# Patient Record
Sex: Male | Born: 1965 | Race: White | Hispanic: No | Marital: Single | Smoking: Former smoker
Health system: Southern US, Community
[De-identification: ages and names within clinical notes are randomized; demographics above are authoritative.]

## PROBLEM LIST (undated history)

## (undated) DIAGNOSIS — E785 Hyperlipidemia, unspecified: Secondary | ICD-10-CM

## (undated) DIAGNOSIS — I209 Angina pectoris, unspecified: Secondary | ICD-10-CM

## (undated) DIAGNOSIS — I219 Acute myocardial infarction, unspecified: Secondary | ICD-10-CM

## (undated) DIAGNOSIS — I2510 Atherosclerotic heart disease of native coronary artery without angina pectoris: Secondary | ICD-10-CM

## (undated) DIAGNOSIS — R011 Cardiac murmur, unspecified: Secondary | ICD-10-CM

## (undated) DIAGNOSIS — I10 Essential (primary) hypertension: Secondary | ICD-10-CM

## (undated) HISTORY — PX: AORTIC VALVE REPLACEMENT: SHX41

---

## 1999-12-13 HISTORY — PX: OTHER SURGICAL HISTORY: SHX169

## 2003-03-15 HISTORY — PX: CORONARY STENT PLACEMENT: SHX1402

## 2003-10-14 ENCOUNTER — Inpatient Hospital Stay (HOSPITAL_COMMUNITY): Admission: AD | Admit: 2003-10-14 | Discharge: 2003-10-17 | Payer: Self-pay | Admitting: Cardiology

## 2003-10-14 ENCOUNTER — Encounter: Payer: Self-pay | Admitting: Emergency Medicine

## 2003-10-17 ENCOUNTER — Inpatient Hospital Stay (HOSPITAL_COMMUNITY): Admission: AD | Admit: 2003-10-17 | Discharge: 2003-10-19 | Payer: Self-pay | Admitting: Cardiology

## 2003-10-17 ENCOUNTER — Encounter: Payer: Self-pay | Admitting: Emergency Medicine

## 2013-10-20 ENCOUNTER — Emergency Department (HOSPITAL_COMMUNITY): Payer: Self-pay

## 2013-10-20 ENCOUNTER — Emergency Department: Payer: Self-pay | Admitting: Emergency Medicine

## 2013-10-20 ENCOUNTER — Inpatient Hospital Stay (HOSPITAL_COMMUNITY)
Admission: EM | Admit: 2013-10-20 | Discharge: 2013-10-22 | DRG: 082 | Disposition: A | Discharge status: Home / Self Care | Payer: Self-pay | Attending: General Surgery | Admitting: General Surgery

## 2013-10-20 ENCOUNTER — Encounter (HOSPITAL_COMMUNITY): Payer: Self-pay | Admitting: Emergency Medicine

## 2013-10-20 DIAGNOSIS — S066X9A Traumatic subarachnoid hemorrhage with loss of consciousness of unspecified duration, initial encounter: Secondary | ICD-10-CM

## 2013-10-20 DIAGNOSIS — I10 Essential (primary) hypertension: Secondary | ICD-10-CM | POA: Diagnosis present

## 2013-10-20 DIAGNOSIS — S0100XA Unspecified open wound of scalp, initial encounter: Secondary | ICD-10-CM | POA: Diagnosis present

## 2013-10-20 DIAGNOSIS — F17200 Nicotine dependence, unspecified, uncomplicated: Secondary | ICD-10-CM | POA: Diagnosis present

## 2013-10-20 DIAGNOSIS — I2510 Atherosclerotic heart disease of native coronary artery without angina pectoris: Secondary | ICD-10-CM | POA: Diagnosis present

## 2013-10-20 DIAGNOSIS — I252 Old myocardial infarction: Secondary | ICD-10-CM

## 2013-10-20 DIAGNOSIS — Z87828 Personal history of other (healed) physical injury and trauma: Secondary | ICD-10-CM

## 2013-10-20 DIAGNOSIS — S0101XA Laceration without foreign body of scalp, initial encounter: Secondary | ICD-10-CM

## 2013-10-20 DIAGNOSIS — F10929 Alcohol use, unspecified with intoxication, unspecified: Secondary | ICD-10-CM | POA: Diagnosis present

## 2013-10-20 DIAGNOSIS — E785 Hyperlipidemia, unspecified: Secondary | ICD-10-CM | POA: Diagnosis present

## 2013-10-20 DIAGNOSIS — S066XAA Traumatic subarachnoid hemorrhage with loss of consciousness status unknown, initial encounter: Secondary | ICD-10-CM

## 2013-10-20 DIAGNOSIS — I609 Nontraumatic subarachnoid hemorrhage, unspecified: Secondary | ICD-10-CM | POA: Insufficient documentation

## 2013-10-20 DIAGNOSIS — K219 Gastro-esophageal reflux disease without esophagitis: Secondary | ICD-10-CM | POA: Diagnosis present

## 2013-10-20 DIAGNOSIS — J9600 Acute respiratory failure, unspecified whether with hypoxia or hypercapnia: Secondary | ICD-10-CM | POA: Diagnosis present

## 2013-10-20 DIAGNOSIS — F1010 Alcohol abuse, uncomplicated: Secondary | ICD-10-CM | POA: Diagnosis present

## 2013-10-20 HISTORY — DX: Atherosclerotic heart disease of native coronary artery without angina pectoris: I25.10

## 2013-10-20 HISTORY — DX: Acute myocardial infarction, unspecified: I21.9

## 2013-10-20 HISTORY — DX: Angina pectoris, unspecified: I20.9

## 2013-10-20 HISTORY — DX: Cardiac murmur, unspecified: R01.1

## 2013-10-20 HISTORY — DX: Essential (primary) hypertension: I10

## 2013-10-20 LAB — I-STAT CHEM 8, ED
BUN: 5 mg/dL — ABNORMAL LOW (ref 6–23)
CALCIUM ION: 0.94 mmol/L — AB (ref 1.12–1.23)
CREATININE: 1.3 mg/dL (ref 0.50–1.35)
Chloride: 102 meq/L (ref 96–112)
GLUCOSE: 98 mg/dL (ref 70–99)
HEMATOCRIT: 50 % (ref 39.0–52.0)
HEMOGLOBIN: 17 g/dL (ref 13.0–17.0)
POTASSIUM: 4 meq/L (ref 3.7–5.3)
Sodium: 141 meq/L (ref 137–147)
TCO2: 24 mmol/L (ref 0–100)

## 2013-10-20 LAB — BASIC METABOLIC PANEL
Anion Gap: 12 (ref 7–16)
BUN: 5 mg/dL — ABNORMAL LOW (ref 7–18)
Calcium, Total: 8.2 mg/dL — ABNORMAL LOW (ref 8.5–10.1)
Chloride: 103 mmol/L (ref 98–107)
Co2: 26 mmol/L (ref 21–32)
Creatinine: 0.87 mg/dL (ref 0.60–1.30)
Glucose: 101 mg/dL — ABNORMAL HIGH (ref 65–99)
Osmolality: 279 (ref 275–301)
Potassium: 2.9 mmol/L — ABNORMAL LOW (ref 3.5–5.1)
SODIUM: 141 mmol/L (ref 136–145)

## 2013-10-20 LAB — CBC WITH DIFFERENTIAL/PLATELET
Basophil #: 0 10*3/uL (ref 0.0–0.1)
Basophil %: 0.4 %
EOS ABS: 0.1 10*3/uL (ref 0.0–0.7)
Eosinophil %: 1.5 %
HCT: 51.5 % (ref 40.0–52.0)
HGB: 17.3 g/dL (ref 13.0–18.0)
LYMPHS ABS: 2.3 10*3/uL (ref 1.0–3.6)
LYMPHS PCT: 29.2 %
MCH: 33.4 pg (ref 26.0–34.0)
MCHC: 33.6 g/dL (ref 32.0–36.0)
MCV: 100 fL (ref 80–100)
Monocyte #: 0.7 x10 3/mm (ref 0.2–1.0)
Monocyte %: 8.4 %
NEUTROS PCT: 60.5 %
Neutrophil #: 4.8 10*3/uL (ref 1.4–6.5)
Platelet: 189 10*3/uL (ref 150–440)
RBC: 5.18 10*6/uL (ref 4.40–5.90)
RDW: 13.8 % (ref 11.5–14.5)
WBC: 7.9 10*3/uL (ref 3.8–10.6)

## 2013-10-20 LAB — PROTIME-INR
INR: 0.9
Prothrombin Time: 12 secs (ref 11.5–14.7)

## 2013-10-20 LAB — ETHANOL
ETHANOL %: 0.424 % — AB (ref 0.000–0.080)
ETHANOL LVL: 424 mg/dL — AB

## 2013-10-20 LAB — I-STAT CG4 LACTIC ACID, ED: Lactic Acid, Venous: 2.57 mmol/L — ABNORMAL HIGH (ref 0.5–2.2)

## 2013-10-20 MED ORDER — MIDAZOLAM HCL 2 MG/2ML IJ SOLN
INTRAMUSCULAR | Status: AC
  Filled 2013-10-20: qty 4

## 2013-10-20 MED ORDER — PROPOFOL 10 MG/ML IV EMUL
INTRAVENOUS | Status: AC
  Administered 2013-10-21: 25 ug/kg/min via INTRAVENOUS
  Filled 2013-10-20: qty 100

## 2013-10-20 MED ORDER — PANTOPRAZOLE SODIUM 40 MG PO TBEC
40.0000 mg | DELAYED_RELEASE_TABLET | Freq: Every day | ORAL | Status: DC
  Administered 2013-10-21 – 2013-10-22 (×2): 40 mg via ORAL
  Filled 2013-10-20 (×2): qty 1

## 2013-10-20 MED ORDER — CETYLPYRIDINIUM CHLORIDE 0.05 % MT LIQD
7.0000 mL | Freq: Four times a day (QID) | OROMUCOSAL | Status: DC
  Administered 2013-10-21: 7 mL via OROMUCOSAL

## 2013-10-20 MED ORDER — POTASSIUM CHLORIDE IN NACL 20-0.9 MEQ/L-% IV SOLN
INTRAVENOUS | Status: DC
  Administered 2013-10-21 (×2): via INTRAVENOUS
  Filled 2013-10-20 (×4): qty 1000

## 2013-10-20 MED ORDER — CHLORHEXIDINE GLUCONATE 0.12 % MT SOLN
15.0000 mL | Freq: Two times a day (BID) | OROMUCOSAL | Status: DC
  Administered 2013-10-21 (×2): 15 mL via OROMUCOSAL
  Filled 2013-10-20: qty 15

## 2013-10-20 MED ORDER — PROPOFOL 10 MG/ML IV EMUL
0.0000 ug/kg/min | INTRAVENOUS | Status: DC
  Administered 2013-10-21 (×2): 25 ug/kg/min via INTRAVENOUS
  Filled 2013-10-20: qty 100

## 2013-10-20 MED ORDER — FENTANYL CITRATE 0.05 MG/ML IJ SOLN
50.0000 ug | INTRAMUSCULAR | Status: DC | PRN
  Administered 2013-10-21 (×2): 50 ug via INTRAVENOUS
  Filled 2013-10-20 (×2): qty 2

## 2013-10-20 MED ORDER — PANTOPRAZOLE SODIUM 40 MG IV SOLR
40.0000 mg | Freq: Every day | INTRAVENOUS | Status: DC
Start: 2013-10-21 — End: 2013-10-21
  Filled 2013-10-20: qty 40
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
## 2013-10-21 ENCOUNTER — Inpatient Hospital Stay (HOSPITAL_COMMUNITY): Payer: Self-pay

## 2013-10-21 ENCOUNTER — Encounter (HOSPITAL_COMMUNITY): Payer: Self-pay | Admitting: Radiology

## 2013-10-21 LAB — COMPREHENSIVE METABOLIC PANEL
ALT: 51 U/L (ref 0–53)
AST: 60 U/L — ABNORMAL HIGH (ref 0–37)
Albumin: 3.4 g/dL — ABNORMAL LOW (ref 3.5–5.2)
Alkaline Phosphatase: 83 U/L (ref 39–117)
Anion gap: 16 — ABNORMAL HIGH (ref 5–15)
BUN: 5 mg/dL — ABNORMAL LOW (ref 6–23)
CALCIUM: 7.7 mg/dL — AB (ref 8.4–10.5)
CHLORIDE: 108 meq/L (ref 96–112)
CO2: 23 meq/L (ref 19–32)
Creatinine, Ser: 0.73 mg/dL (ref 0.50–1.35)
GFR calc Af Amer: >90 mL/min (ref >90)
Glucose, Bld: 83 mg/dL (ref 70–99)
Potassium: 3.4 mEq/L — ABNORMAL LOW (ref 3.7–5.3)
SODIUM: 147 meq/L (ref 137–147)
Total Bilirubin: 0.4 mg/dL (ref 0.3–1.2)
Total Protein: 6.7 g/dL (ref 6.0–8.3)

## 2013-10-21 LAB — PREPARE FRESH FROZEN PLASMA
Unit division: 0
Unit division: 0

## 2013-10-21 LAB — I-STAT ARTERIAL BLOOD GAS, ED
ACID-BASE DEFICIT: 3 mmol/L — AB (ref 0.0–2.0)
Bicarbonate: 24.3 mEq/L — ABNORMAL HIGH (ref 20.0–24.0)
O2 Saturation: 99 %
PO2 ART: 165 mmHg — AB (ref 80.0–100.0)
Patient temperature: 97.5
TCO2: 26 mmol/L (ref 0–100)
pCO2 arterial: 48.4 mmHg — ABNORMAL HIGH (ref 35.0–45.0)
pH, Arterial: 7.305 — ABNORMAL LOW (ref 7.350–7.450)

## 2013-10-21 LAB — MRSA PCR SCREENING: MRSA BY PCR: NEGATIVE

## 2013-10-21 LAB — CBC
HCT: 43.8 % (ref 39.0–52.0)
HEMOGLOBIN: 15.5 g/dL (ref 13.0–17.0)
MCH: 33.8 pg (ref 26.0–34.0)
MCHC: 35.4 g/dL (ref 30.0–36.0)
MCV: 95.6 fL (ref 78.0–100.0)
Platelets: 163 10*3/uL (ref 150–400)
RBC: 4.58 MIL/uL (ref 4.22–5.81)
RDW: 13.2 % (ref 11.5–15.5)
WBC: 11 10*3/uL — AB (ref 4.0–10.5)

## 2013-10-21 LAB — TRIGLYCERIDES: TRIGLYCERIDES: 196 mg/dL — AB (ref <150)

## 2013-10-21 LAB — I-STAT TROPONIN, ED: Troponin i, poc: 0 ng/mL (ref 0.00–0.08)

## 2013-10-21 MED ORDER — ACETAMINOPHEN 160 MG/5ML PO SOLN: 650.0000 mg | Freq: Four times a day (QID) | ORAL | Status: DC | PRN

## 2013-10-21 MED ORDER — OXYCODONE HCL 5 MG PO TABS
5.0000 mg | ORAL_TABLET | ORAL | Status: DC | PRN
  Administered 2013-10-21 – 2013-10-22 (×7): 10 mg via ORAL
  Filled 2013-10-21 (×7): qty 2

## 2013-10-21 MED ORDER — MIDAZOLAM HCL 5 MG/5ML IJ SOLN
INTRAMUSCULAR | Status: AC | PRN
  Administered 2013-10-20: 4 mg via INTRAVENOUS

## 2013-10-21 MED ORDER — ACETAMINOPHEN 325 MG PO TABS
650.0000 mg | ORAL_TABLET | Freq: Four times a day (QID) | ORAL | Status: DC | PRN
  Administered 2013-10-21 (×2): 650 mg via ORAL
  Filled 2013-10-21 (×2): qty 2

## 2013-10-21 MED ORDER — IOHEXOL 300 MG/ML  SOLN
100.0000 mL | Freq: Once | INTRAMUSCULAR | Status: AC | PRN
  Administered 2013-10-21: 100 mL via INTRAVENOUS

## 2013-10-21 MED ORDER — POTASSIUM CHLORIDE 10 MEQ/100ML IV SOLN
10.0000 meq | INTRAVENOUS | Status: AC
  Administered 2013-10-21 (×2): 10 meq via INTRAVENOUS
  Filled 2013-10-21: qty 100

## 2013-10-21 MED ORDER — SODIUM CHLORIDE 0.9 % IV SOLN
INTRAVENOUS | Status: AC | PRN
  Administered 2013-10-20: 150 mL/h via INTRAVENOUS

## 2013-10-21 MED ORDER — PROPOFOL 10 MG/ML IV BOLUS
INTRAVENOUS | Status: AC | PRN
  Administered 2013-10-20: 20 ug via INTRAVENOUS

## 2013-10-21 MED ORDER — IOHEXOL 350 MG/ML SOLN
50.0000 mL | Freq: Once | INTRAVENOUS | Status: AC | PRN
  Administered 2013-10-21: 50 mL via INTRAVENOUS

## 2013-10-21 NOTE — Progress Notes (Signed)
Pt. Was transported from CT to 3M10 without any complications.

## 2013-10-21 NOTE — Progress Notes (Signed)
Patient ID: Jack [name], male   DOB: 10/06/1965, 48 y.o.   Jack GhentMRN: [id number] Doing well after extubation. Working with therapies. Will D/C foley. Jack GelinasBurke Jermel Artley, MD, MPH, FACS Trauma: [phone] General Surgery: [phone]

## 2013-10-21 NOTE — Progress Notes (Signed)
10/21/2013- Resp care note- Pt placed on wean at 0822.  Tolerated wean vent settings well.  Pt extubated at 0847 to 4lpm cannula.  Pt suctioned and weaned to extubation as per MD order.  Pt tolerated procedure well with pt vocalizing post extubation.  [id number], rr22, bp142/82 on 4lpm cannula.  Will wean oxygen as tolerated.  RN at bedside for procedure.  Will follow progress.  s Rajanee Schuelke rrt, rcp
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
## 2013-10-21 NOTE — Plan of Care (Signed)
Problem: Acute Treatment Outcomes Goal: Pain controlled Outcome: Progressing Oral Oxy and acetaminophen are current PRN meds.  Keeping pain at a level rate of 8 out of 10.
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
## 2013-10-21 NOTE — Progress Notes (Signed)
Pt. Was transported to CT & back to room 3M10 without any complications.

## 2013-10-21 NOTE — Progress Notes (Signed)
PT Cancellation Note  Patient Details Name: Jack [name] MRN: [id number] DOB: 06/15/1965   Cancelled Treatment:    Reason Eval/Treat Not Completed: Patient not medically ready (active bedrest orders at this time, will follow)   Jack [name], Jack [name] 10/21/2013, 8:52 AM Jack [name], PT DPT  [phone]

## 2013-10-22 ENCOUNTER — Encounter (INDEPENDENT_AMBULATORY_CARE_PROVIDER_SITE_OTHER): Payer: Self-pay | Admitting: Orthopedic Surgery

## 2013-10-22 DIAGNOSIS — F10929 Alcohol use, unspecified with intoxication, unspecified: Secondary | ICD-10-CM | POA: Diagnosis present

## 2013-10-22 DIAGNOSIS — I10 Essential (primary) hypertension: Secondary | ICD-10-CM | POA: Insufficient documentation

## 2013-10-22 DIAGNOSIS — S0101XA Laceration without foreign body of scalp, initial encounter: Secondary | ICD-10-CM

## 2013-10-22 DIAGNOSIS — I609 Nontraumatic subarachnoid hemorrhage, unspecified: Secondary | ICD-10-CM | POA: Insufficient documentation

## 2013-10-22 DIAGNOSIS — J9600 Acute respiratory failure, unspecified whether with hypoxia or hypercapnia: Secondary | ICD-10-CM | POA: Diagnosis present

## 2013-10-22 DIAGNOSIS — K219 Gastro-esophageal reflux disease without esophagitis: Secondary | ICD-10-CM | POA: Insufficient documentation

## 2013-10-22 DIAGNOSIS — E785 Hyperlipidemia, unspecified: Secondary | ICD-10-CM | POA: Insufficient documentation

## 2013-10-22 LAB — BASIC METABOLIC PANEL
Anion gap: 10 (ref 5–15)
BUN: 10 mg/dL (ref 6–23)
CO2: 28 meq/L (ref 19–32)
CREATININE: 0.74 mg/dL (ref 0.50–1.35)
Calcium: 8.3 mg/dL — ABNORMAL LOW (ref 8.4–10.5)
Chloride: 102 mEq/L (ref 96–112)
GFR calc Af Amer: >90 mL/min (ref >90)
GLUCOSE: 109 mg/dL — AB (ref 70–99)
Potassium: 3.7 mEq/L (ref 3.7–5.3)
Sodium: 140 mEq/L (ref 137–147)

## 2013-10-22 LAB — CBC
HCT: 40 % (ref 39.0–52.0)
HEMOGLOBIN: 13.7 g/dL (ref 13.0–17.0)
MCH: 33.3 pg (ref 26.0–34.0)
MCHC: 34.3 g/dL (ref 30.0–36.0)
MCV: 97.3 fL (ref 78.0–100.0)
Platelets: 142 10*3/uL — ABNORMAL LOW (ref 150–400)
RBC: 4.11 MIL/uL — AB (ref 4.22–5.81)
RDW: 13.1 % (ref 11.5–15.5)
WBC: 7.8 10*3/uL (ref 4.0–10.5)

## 2013-10-22 MED ORDER — OXYCODONE-ACETAMINOPHEN 5-325 MG PO TABS: 1.0000 | ORAL_TABLET | ORAL | Status: DC | PRN
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
## 2013-10-22 NOTE — Progress Notes (Signed)
[place] work note from [place]
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
## 2013-10-22 NOTE — Progress Notes (Signed)
Pt given discharge summary including medication list, prescription, education and work note. Information reviewed with pt. Pt d/c home by wheelchair and RN escort with sister and gf.

## 2013-10-22 NOTE — Discharge Summary (Signed)
Physician Discharge Summary  Patient ID: Jack GhentMichael Gunawan MRN: [id number] DOB/AGE: 47/09/1965 47 y.o.  Admit date: 10/20/2013 Discharge date: 10/22/2013  Discharge Diagnoses Patient Active Problem List   Diagnosis Date Noted  . Scalp laceration 10/22/2013  . Alcohol intoxication 10/22/2013  . Acute respiratory failure 10/22/2013  . HTN (hypertension) 10/22/2013  . GERD (gastroesophageal reflux disease) 10/22/2013  . Hyperlipidemia 10/22/2013  . Traumatic subarachnoid hemorrhage 10/20/2013    Consultants Dr. Aliene Beamsandy Kritzer for neurosurgery   Procedures None   HPI: [name] was found down unresponsive. He was [facility] to [facility]. He was intubated and workup found a subarachnoid hemorrhage. [facility] Neurosurgery was consulted and the patient was admitted to the trauma service.   Hospital Course: Neurosurgery was worried about an aneurysmal bleed given the unusual location but CT angiography ruled that out. He was able to be weaned and extubated without difficulty. The patient had no recollection of the etiology of his trauma. He was evaluated by physical and speech therapy and did well. His pain was controlled on oral medications and he was discharged home in improved condition.      Medication List         enalapril 5 MG tablet  Commonly known as:  VASOTEC  Take 5 mg by mouth daily.     hydrochlorothiazide 12.5 MG tablet  Commonly known as:  HYDRODIURIL  Take 25 mg by mouth daily.     lovastatin 10 MG tablet  Commonly known as:  MEVACOR  Take 20 mg by mouth at bedtime.     metoprolol tartrate 25 MG tablet  Commonly known as:  LOPRESSOR  Take 25 mg by mouth 2 (two) times daily.     omeprazole 20 MG capsule  Commonly known as:  PRILOSEC  Take 20 mg by mouth daily.     oxyCODONE-acetaminophen 5-325 MG per tablet  Commonly known as:  ROXICET  Take 1-2 tablets by mouth every 4 (four) hours as needed (Pain).             Follow-up Information   Schedule an appointment as soon as possible for a visit with [name],[name], MD.   Specialty:  Neurosurgery   Contact information:   [address] [place] [address] [phone]       Call [facility]. (As needed)    Contact information:   [address] [place] [address] [phone]       Signed: [name], PA-C Pager: [phone] General Trauma PA Pager: [phone] 10/22/2013, 8:32 AM
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
## 2013-10-22 NOTE — Discharge Summary (Signed)
Discharge is appropriate.  Follow up with neurosurgery.  This patient has been seen and I agree with the findings and treatment plan.  Marta LamasJames O. Gae BonWyatt, III, MD, FACS [phone] (pager) [phone] (direct pager) Trauma Surgeon

## 2013-10-22 NOTE — Progress Notes (Signed)
Appropriate for discharge  This patient has been seen and I agree with the findings and treatment plan.  Marta LamasJames O. Gae BonWyatt, III, MD, FACS [phone] (pager) [phone] (direct pager) Trauma Surgeon

## 2013-10-22 NOTE — Discharge Instructions (Signed)
No driving while taking oxycodone. °
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
## 2014-10-20 ENCOUNTER — Emergency Department
Admission: EM | Admit: 2014-10-20 | Discharge: 2014-10-20 | Disposition: A | Discharge status: Home / Self Care | Payer: Self-pay | Attending: Emergency Medicine | Admitting: Emergency Medicine

## 2014-10-20 ENCOUNTER — Encounter: Payer: Self-pay | Admitting: Emergency Medicine

## 2014-10-20 ENCOUNTER — Other Ambulatory Visit: Payer: Self-pay

## 2014-10-20 DIAGNOSIS — R42 Dizziness and giddiness: Secondary | ICD-10-CM | POA: Insufficient documentation

## 2014-10-20 DIAGNOSIS — I159 Secondary hypertension, unspecified: Secondary | ICD-10-CM | POA: Insufficient documentation

## 2014-10-20 DIAGNOSIS — Z720 Tobacco use: Secondary | ICD-10-CM | POA: Insufficient documentation

## 2014-10-20 DIAGNOSIS — Z79899 Other long term (current) drug therapy: Secondary | ICD-10-CM | POA: Insufficient documentation

## 2014-10-20 LAB — BASIC METABOLIC PANEL
Anion gap: 8 (ref 5–15)
BUN: 6 mg/dL (ref 6–20)
CO2: 28 mmol/L (ref 22–32)
Calcium: 9.2 mg/dL (ref 8.9–10.3)
Chloride: 103 mmol/L (ref 101–111)
Creatinine, Ser: 0.82 mg/dL (ref 0.61–1.24)
GFR calc Af Amer: >60 mL/min (ref >60)
GFR calc non Af Amer: >60 mL/min (ref >60)
Glucose, Bld: 110 mg/dL — ABNORMAL HIGH (ref 65–99)
Potassium: 4.1 mmol/L (ref 3.5–5.1)
Sodium: 139 mmol/L (ref 135–145)

## 2014-10-20 LAB — TROPONIN I: Troponin I: <0.03 ng/mL (ref <0.031)

## 2014-10-20 LAB — CBC
HEMATOCRIT: 48.8 % (ref 40.0–52.0)
HEMOGLOBIN: 17 g/dL (ref 13.0–18.0)
MCH: 35.2 pg — ABNORMAL HIGH (ref 26.0–34.0)
MCHC: 34.8 g/dL (ref 32.0–36.0)
MCV: 101.1 fL — AB (ref 80.0–100.0)
Platelets: 208 10*3/uL (ref 150–440)
RBC: 4.83 MIL/uL (ref 4.40–5.90)
RDW: 13.6 % (ref 11.5–14.5)
WBC: 11.1 10*3/uL — AB (ref 3.8–10.6)

## 2014-10-20 MED ORDER — LOVASTATIN 20 MG PO TABS: 20.0000 mg | ORAL_TABLET | Freq: Every day | ORAL | Status: DC

## 2014-10-20 MED ORDER — ENALAPRIL-HYDROCHLOROTHIAZIDE 5-12.5 MG PO TABS: 1.0000 | ORAL_TABLET | Freq: Every day | ORAL | Status: DC

## 2014-10-20 MED ORDER — METOPROLOL TARTRATE 25 MG PO TABS: 25.0000 mg | ORAL_TABLET | Freq: Two times a day (BID) | ORAL | Status: DC

## 2014-10-20 NOTE — ED Notes (Signed)
Pt presents with dizziness started this am. Pt reports being out of bp for two mths.
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
## 2014-10-20 NOTE — ED Notes (Signed)
Pt reports he has not been taking prescribed bp medications for 3 months due to not being able to afford it, reports he felt dizzy earlier but this has passed and that he has not eaten anything today

## 2014-10-20 NOTE — Discharge Instructions (Signed)
Dizziness °Dizziness is a common problem. It is a feeling of unsteadiness or light-headedness. You may feel like you are about to faint. Dizziness can lead to injury if you stumble or fall. A person of any age group can suffer from dizziness, but dizziness is more common in older adults. °CAUSES  °Dizziness can be caused by many different things, including: °· Middle ear problems. °· Standing for too long. °· Infections. °· An allergic reaction. °· Aging. °· An emotional response to something, such as the sight of blood. °· Side effects of medicines. °· Tiredness. °· Problems with circulation or blood pressure. °· Excessive use of alcohol or medicines, or illegal drug use. °· Breathing too fast (hyperventilation). °· An irregular heart rhythm (arrhythmia). °· A low red blood cell count (anemia). °· Pregnancy. °· Vomiting, diarrhea, fever, or other illnesses that cause body fluid loss (dehydration). °· Diseases or conditions such as Parkinson's disease, high blood pressure (hypertension), diabetes, and thyroid problems. °· Exposure to extreme heat. °DIAGNOSIS  °Your health care provider will ask about your symptoms, perform a physical exam, and perform an electrocardiogram (ECG) to record the electrical activity of your heart. Your health care provider may also perform other heart or blood tests to determine the cause of your dizziness. These may include: °· Transthoracic echocardiogram (TTE). During echocardiography, sound waves are used to evaluate how blood flows through your heart. °· Transesophageal echocardiogram (TEE). °· Cardiac monitoring. This allows your health care provider to monitor your heart rate and rhythm in real time. °· Holter monitor. This is a portable device that records your heartbeat and can help diagnose heart arrhythmias. It allows your health care provider to track your heart activity for several days if needed. °· Stress tests by exercise or by giving medicine that makes the heart beat  faster. °TREATMENT  °Treatment of dizziness depends on the cause of your symptoms and can vary greatly. °HOME CARE INSTRUCTIONS  °· Drink enough fluids to keep your urine clear or pale yellow. This is especially important in very hot weather. In older adults, it is also important in cold weather. °· Take your medicine exactly as directed if your dizziness is caused by medicines. When taking blood pressure medicines, it is especially important to get up slowly. °· Rise slowly from chairs and steady yourself until you feel okay. °· In the morning, first sit up on the side of the bed. When you feel okay, stand slowly while holding onto something until you know your balance is fine. °· Move your legs often if you need to stand in one place for a long time. Tighten and relax your muscles in your legs while standing. °· Have someone stay with you for 1-2 days if dizziness continues to be a problem. Do this until you feel you are well enough to stay alone. Have the person call your health care provider if he or she notices changes in you that are concerning. °· Do not drive or use heavy machinery if you feel dizzy. °· Do not drink alcohol. °SEEK IMMEDIATE MEDICAL CARE IF:  °· Your dizziness or light-headedness gets worse. °· You feel nauseous or vomit. °· You have problems talking, walking, or using your arms, hands, or legs. °· You feel weak. °· You are not thinking clearly or you have trouble forming sentences. It may take a friend or family member to notice this. °· You have chest pain, abdominal pain, shortness of breath, or sweating. °· Your vision changes. °· You notice   any bleeding.  You have side effects from medicine that seems to be getting worse rather than better. MAKE SURE YOU:   Understand these instructions.  Will watch your condition.  Will get help right away if you are not doing well or get worse. Document Released: 08/24/2000 Document Revised: 03/05/2013 Document Reviewed: 09/17/2010 [facility]  Patient Information 2015 [place], [place]. This information is not intended to replace advice given to you by your health care provider. Make sure you discuss any questions you have with your health care provider.  Hypertension Hypertension is another name for high blood pressure. High blood pressure forces your heart to work harder to pump blood. A blood pressure reading has two numbers, which includes a higher number over a lower number (example: 110/72). HOME CARE   Have your blood pressure rechecked by your doctor.  Only take medicine as told by your doctor. Follow the directions carefully. The medicine does not work as well if you skip doses. Skipping doses also puts you at risk for problems.  Do not smoke.  Monitor your blood pressure at home as told by your doctor. GET HELP IF:  You think you are having a reaction to the medicine you are taking.  You have repeat headaches or feel dizzy.  You have puffiness (swelling) in your ankles.  You have trouble with your vision. GET HELP RIGHT AWAY IF:   You get a very bad headache and are confused.  You feel weak, numb, or faint.  You get chest or belly (abdominal) pain.  You throw up (vomit).  You cannot breathe very well. MAKE SURE YOU:   Understand these instructions.  Will watch your condition.  Will get help right away if you are not doing well or get worse. Document Released: 08/17/2007 Document Revised: 03/05/2013 Document Reviewed: 12/21/2012 [facility] Patient Information 2015 [place], [place]. This information is not intended to replace advice given to you by your health care provider. Make sure you discuss any questions you have with your health care provider.

## 2014-11-08 ENCOUNTER — Emergency Department
Admission: EM | Admit: 2014-11-08 | Discharge: 2014-11-08 | Disposition: A | Discharge status: Home / Self Care | Payer: PRIVATE HEALTH INSURANCE | Attending: Emergency Medicine | Admitting: Emergency Medicine

## 2014-11-08 ENCOUNTER — Encounter: Payer: Self-pay | Admitting: Emergency Medicine

## 2014-11-08 DIAGNOSIS — Z720 Tobacco use: Secondary | ICD-10-CM | POA: Diagnosis not present

## 2014-11-08 DIAGNOSIS — I10 Essential (primary) hypertension: Secondary | ICD-10-CM | POA: Diagnosis not present

## 2014-11-08 DIAGNOSIS — M5442 Lumbago with sciatica, left side: Secondary | ICD-10-CM | POA: Diagnosis not present

## 2014-11-08 DIAGNOSIS — Z79899 Other long term (current) drug therapy: Secondary | ICD-10-CM | POA: Insufficient documentation

## 2014-11-08 DIAGNOSIS — M545 Low back pain: Secondary | ICD-10-CM | POA: Diagnosis present

## 2014-11-08 MED ORDER — KETOROLAC TROMETHAMINE 10 MG PO TABS: 10.0000 mg | ORAL_TABLET | Freq: Four times a day (QID) | ORAL | Status: DC | PRN

## 2014-11-08 MED ORDER — OXYCODONE HCL 5 MG PO TABS
5.0000 mg | ORAL_TABLET | Freq: Once | ORAL | Status: AC
Start: 2014-11-08 — End: 2014-11-08
  Administered 2014-11-08: 5 mg via ORAL
  Filled 2014-11-08: qty 1

## 2014-11-08 MED ORDER — KETOROLAC TROMETHAMINE 60 MG/2ML IM SOLN
60.0000 mg | Freq: Once | INTRAMUSCULAR | Status: AC
  Administered 2014-11-08: 60 mg via INTRAMUSCULAR
  Filled 2014-11-08: qty 2

## 2014-11-08 MED ORDER — DIAZEPAM 2 MG PO TABS: 2.0000 mg | ORAL_TABLET | Freq: Three times a day (TID) | ORAL | Status: DC | PRN

## 2014-11-08 MED ORDER — DIAZEPAM 5 MG PO TABS
5.0000 mg | ORAL_TABLET | Freq: Once | ORAL | Status: AC
  Administered 2014-11-08: 5 mg via ORAL
  Filled 2014-11-08: qty 1
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
## 2014-11-08 NOTE — ED Notes (Signed)
Reports doing yard work yesterday and today woke up with back pain.  Worse with movement.

## 2015-04-08 ENCOUNTER — Emergency Department: Payer: Self-pay

## 2015-04-08 ENCOUNTER — Emergency Department: Payer: PRIVATE HEALTH INSURANCE

## 2015-04-08 ENCOUNTER — Encounter: Payer: Self-pay | Admitting: *Deleted

## 2015-04-08 ENCOUNTER — Emergency Department
Admission: EM | Admit: 2015-04-08 | Discharge: 2015-04-08 | Disposition: A | Discharge status: Home / Self Care | Payer: PRIVATE HEALTH INSURANCE | Attending: Emergency Medicine | Admitting: Emergency Medicine

## 2015-04-08 DIAGNOSIS — W11XXXA Fall on and from ladder, initial encounter: Secondary | ICD-10-CM | POA: Insufficient documentation

## 2015-04-08 DIAGNOSIS — Y998 Other external cause status: Secondary | ICD-10-CM | POA: Insufficient documentation

## 2015-04-08 DIAGNOSIS — Z79899 Other long term (current) drug therapy: Secondary | ICD-10-CM | POA: Insufficient documentation

## 2015-04-08 DIAGNOSIS — Y9389 Activity, other specified: Secondary | ICD-10-CM | POA: Insufficient documentation

## 2015-04-08 DIAGNOSIS — S20211A Contusion of right front wall of thorax, initial encounter: Secondary | ICD-10-CM | POA: Insufficient documentation

## 2015-04-08 DIAGNOSIS — Y9289 Other specified places as the place of occurrence of the external cause: Secondary | ICD-10-CM | POA: Insufficient documentation

## 2015-04-08 DIAGNOSIS — W19XXXA Unspecified fall, initial encounter: Secondary | ICD-10-CM

## 2015-04-08 DIAGNOSIS — I10 Essential (primary) hypertension: Secondary | ICD-10-CM | POA: Insufficient documentation

## 2015-04-08 DIAGNOSIS — S5002XA Contusion of left elbow, initial encounter: Secondary | ICD-10-CM | POA: Insufficient documentation

## 2015-04-08 DIAGNOSIS — F17210 Nicotine dependence, cigarettes, uncomplicated: Secondary | ICD-10-CM | POA: Insufficient documentation

## 2015-04-08 MED ORDER — OXYCODONE-ACETAMINOPHEN 5-325 MG PO TABS: 1.0000 | ORAL_TABLET | ORAL | Status: DC | PRN

## 2015-04-08 MED ORDER — IBUPROFEN 800 MG PO TABS: 800.0000 mg | ORAL_TABLET | Freq: Three times a day (TID) | ORAL | Status: DC | PRN

## 2015-04-08 MED ORDER — SULFAMETHOXAZOLE-TRIMETHOPRIM 800-160 MG PO TABS: 1.0000 | ORAL_TABLET | Freq: Two times a day (BID) | ORAL | Status: DC

## 2015-04-08 MED ORDER — OXYCODONE-ACETAMINOPHEN 5-325 MG PO TABS
2.0000 | ORAL_TABLET | Freq: Once | ORAL | Status: AC
  Administered 2015-04-08: 2 via ORAL
  Filled 2015-04-08: qty 2

## 2015-04-08 MED ORDER — KETOROLAC TROMETHAMINE 60 MG/2ML IM SOLN
60.0000 mg | Freq: Once | INTRAMUSCULAR | Status: AC
  Administered 2015-04-08: 60 mg via INTRAMUSCULAR
  Filled 2015-04-08: qty 2
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
## 2015-04-08 NOTE — Discharge Instructions (Signed)
Cryotherapy °Cryotherapy means treatment with cold. Ice or gel packs can be used to reduce both pain and swelling. Ice is the most helpful within the first 24 to 48 hours after an injury or flare-up from overusing a muscle or joint. Sprains, strains, spasms, burning pain, shooting pain, and aches can all be eased with ice. Ice can also be used when recovering from surgery. Ice is effective, has very few side effects, and is safe for most people to use. °PRECAUTIONS  °Ice is not a safe treatment option for people with: °· Raynaud phenomenon. This is a condition affecting small blood vessels in the extremities. Exposure to cold may cause your problems to return. °· Cold hypersensitivity. There are many forms of cold hypersensitivity, including: °· Cold urticaria. Red, itchy hives appear on the skin when the tissues begin to warm after being iced. °· Cold erythema. This is a red, itchy rash caused by exposure to cold. °· Cold hemoglobinuria. Red blood cells break down when the tissues begin to warm after being iced. The hemoglobin that carry oxygen are passed into the urine because they cannot combine with blood proteins fast enough. °· Numbness or altered sensitivity in the area being iced. °If you have any of the following conditions, do not use ice until you have discussed cryotherapy with your caregiver: °· Heart conditions, such as arrhythmia, angina, or chronic heart disease. °· High blood pressure. °· Healing wounds or open skin in the area being iced. °· Current infections. °· Rheumatoid arthritis. °· Poor circulation. °· Diabetes. °Ice slows the blood flow in the region it is applied. This is beneficial when trying to stop inflamed tissues from spreading irritating chemicals to surrounding tissues. However, if you expose your skin to cold temperatures for too long or without the proper protection, you can damage your skin or nerves. Watch for signs of skin damage due to cold. °HOME CARE INSTRUCTIONS °Follow  these tips to use ice and cold packs safely. °· Place a dry or damp towel between the ice and skin. A damp towel will cool the skin more quickly, so you may need to shorten the time that the ice is used. °· For a more rapid response, add gentle compression to the ice. °· Ice for no more than 10 to 20 minutes at a time. The bonier the area you are icing, the less time it will take to get the benefits of ice. °· Check your skin after 5 minutes to make sure there are no signs of a poor response to cold or skin damage. °· Rest 20 minutes or more between uses. °· Once your skin is numb, you can end your treatment. You can test numbness by very lightly touching your skin. The touch should be so light that you do not see the skin dimple from the pressure of your fingertip. When using ice, most people will feel these normal sensations in this order: cold, burning, aching, and numbness. °· Do not use ice on someone who cannot communicate their responses to pain, such as small children or people with dementia. °HOW TO MAKE AN ICE PACK °Ice packs are the most common way to use ice therapy. Other methods include ice massage, ice baths, and cryosprays. Muscle creams that cause a cold, tingly feeling do not offer the same benefits that ice offers and should not be used as a substitute unless recommended by your caregiver. °To make an ice pack, do one of the following: °· Place crushed ice or a   bag of frozen vegetables in a sealable plastic bag. Squeeze out the excess air. Place this bag inside another plastic bag. Slide the bag into a pillowcase or place a damp towel between your skin and the bag.  Mix 3 parts water with 1 part rubbing alcohol. Freeze the mixture in a sealable plastic bag. When you remove the mixture from the freezer, it will be slushy. Squeeze out the excess air. Place this bag inside another plastic bag. Slide the bag into a pillowcase or place a damp towel between your skin and the bag. SEEK MEDICAL CARE  IF:  You develop white spots on your skin. This may give the skin a blotchy (mottled) appearance.  Your skin turns blue or pale.  Your skin becomes waxy or hard.  Your swelling gets worse. MAKE SURE YOU:   Understand these instructions.  Will watch your condition.  Will get help right away if you are not doing well or get worse.   This information is not intended to replace advice given to you by your health care provider. Make sure you discuss any questions you have with your health care provider.   Document Released: 10/25/2010 Document Revised: 03/21/2014 Document Reviewed: 10/25/2010 Elsevier Interactive Patient Education 2016 [employer].  Elbow Contusion An elbow contusion is a deep bruise of the elbow. Contusions are the result of an injury that caused bleeding under the skin. The contusion may turn blue, purple, or yellow. Minor injuries will give you a painless contusion, but more severe contusions may stay painful and swollen for a few weeks.  CAUSES  An elbow contusion comes from a direct force to that area, such as falling on the elbow. SYMPTOMS   Swelling and redness of the elbow.  Bruising of the elbow area.  Tenderness or soreness of the elbow. DIAGNOSIS  You will have a physical exam and will be asked about your history. You may need an X-ray of your elbow to look for a broken bone (fracture).  TREATMENT  A sling or splint may be needed to support your injury. Resting, elevating, and applying cold compresses to the elbow area are often the best treatments for an elbow contusion. Over-the-counter medicines may also be recommended for pain control. HOME CARE INSTRUCTIONS   Put ice on the injured area.  Put ice in a plastic bag.  Place a towel between your skin and the bag.  Leave the ice on for 15-20 minutes, 03-04 times a day.  Only take over-the-counter or prescription medicines for pain, discomfort, or fever as directed by your caregiver.  Rest your  injured elbow until the pain and swelling are better.  Elevate your elbow to reduce swelling.  Apply a compression wrap as directed by your caregiver. This can help reduce swelling and motion. You may remove the wrap for sleeping, showers, and baths. If your fingers become numb, cold, or blue, take the wrap off and reapply it more loosely.  Use your elbow only as directed by your caregiver. You may be asked to do range of motion exercises. Do them as directed.  See your caregiver as directed. It is very important to keep all follow-up appointments in order to avoid any long-term problems with your elbow, including chronic pain or inability to move your elbow normally. SEEK IMMEDIATE MEDICAL CARE IF:   You have increased redness, swelling, or pain in your elbow.  Your swelling or pain is not relieved with medicines.  You have swelling of the hand and fingers.  You are unable to move your fingers or wrist.  You begin to lose feeling in your hand or fingers.  Your fingers or hand become cold or blue. MAKE SURE YOU:   Understand these instructions.  Will watch your condition.  Will get help right away if you are not doing well or get worse.   This information is not intended to replace advice given to you by your health care provider. Make sure you discuss any questions you have with your health care provider.   Document Released: 02/06/2006 Document Revised: 05/23/2011 Document Reviewed: 10/13/2014 Elsevier Interactive Patient Education 2016 [employer]  Rib Contusion A rib contusion is a deep bruise on your rib area. Contusions are the result of a blunt trauma that causes bleeding and injury to the tissues under the skin. A rib contusion may involve bruising of the ribs and of the skin and muscles in the area. The skin overlying the contusion may turn blue, purple, or yellow. Minor injuries will give you a painless contusion, but more severe contusions may stay painful and  swollen for a few weeks. CAUSES  A contusion is usually caused by a blow, trauma, or direct force to an area of the body. This often occurs while playing contact sports. SYMPTOMS  Swelling and redness of the injured area.  Discoloration of the injured area.  Tenderness and soreness of the injured area.  Pain with or without movement. DIAGNOSIS  The diagnosis can be made by taking a medical history and performing a physical exam. An X-ray, CT scan, or MRI may be needed to determine if there were any associated injuries, such as broken bones (fractures) or internal injuries. TREATMENT  Often, the best treatment for a rib contusion is rest. Icing or applying cold compresses to the injured area may help reduce swelling and inflammation. Deep breathing exercises may be recommended to reduce the risk of partial lung collapse and pneumonia. Over-the-counter or prescription medicines may also be recommended for pain control. HOME CARE INSTRUCTIONS   Apply ice to the injured area:  Put ice in a plastic bag.  Place a towel between your skin and the bag.  Leave the ice on for 20 minutes, 2-3 times per day.  Take medicines only as directed by your health care provider.  Rest the injured area. Avoid strenuous activity and any activities or movements that cause pain. Be careful during activities and avoid bumping the injured area.  Perform deep-breathing exercises as directed by your health care provider.  Do not lift anything that is heavier than 5 lb (2.3 kg) until your health care provider approves.  Do not use any tobacco products, including cigarettes, chewing tobacco, or electronic cigarettes. If you need help quitting, ask your health care provider. SEEK MEDICAL CARE IF:   You have increased bruising or swelling.  You have pain that is not controlled with treatment.  You have a fever. SEEK IMMEDIATE MEDICAL CARE IF:   You have difficulty breathing or shortness of breath.  You  develop a continual cough, or you cough up thick or bloody sputum.  You feel sick to your stomach (nauseous), you throw up (vomit), or you have abdominal pain.   This information is not intended to replace advice given to you by your health care provider. Make sure you discuss any questions you have with your health care provider.   Document Released: 11/23/2000 Document Revised: 03/21/2014 Document Reviewed: 12/10/2013 Elsevier Interactive Patient Education [employer]

## 2015-04-08 NOTE — ED Notes (Addendum)
Pt fell off a ladder yesterday appro 10 feet.  No loc.  No h/a No neck or back pain.  Pt has left elbow swelling and pain. Pt also has right anterior rib pain.  No sob.  cig smoker.    Pt alert and speech clear.

## 2015-04-25 ENCOUNTER — Emergency Department: Payer: PRIVATE HEALTH INSURANCE

## 2015-04-25 ENCOUNTER — Emergency Department
Admission: EM | Admit: 2015-04-25 | Discharge: 2015-04-25 | Disposition: A | Discharge status: Home / Self Care | Payer: PRIVATE HEALTH INSURANCE | Attending: Emergency Medicine | Admitting: Emergency Medicine

## 2015-04-25 ENCOUNTER — Encounter: Payer: Self-pay | Admitting: Emergency Medicine

## 2015-04-25 DIAGNOSIS — Z79899 Other long term (current) drug therapy: Secondary | ICD-10-CM | POA: Insufficient documentation

## 2015-04-25 DIAGNOSIS — W182XXA Fall in (into) shower or empty bathtub, initial encounter: Secondary | ICD-10-CM | POA: Insufficient documentation

## 2015-04-25 DIAGNOSIS — F10129 Alcohol abuse with intoxication, unspecified: Secondary | ICD-10-CM | POA: Insufficient documentation

## 2015-04-25 DIAGNOSIS — Z792 Long term (current) use of antibiotics: Secondary | ICD-10-CM | POA: Insufficient documentation

## 2015-04-25 DIAGNOSIS — I10 Essential (primary) hypertension: Secondary | ICD-10-CM | POA: Insufficient documentation

## 2015-04-25 DIAGNOSIS — Y998 Other external cause status: Secondary | ICD-10-CM | POA: Insufficient documentation

## 2015-04-25 DIAGNOSIS — F10929 Alcohol use, unspecified with intoxication, unspecified: Secondary | ICD-10-CM

## 2015-04-25 DIAGNOSIS — F17210 Nicotine dependence, cigarettes, uncomplicated: Secondary | ICD-10-CM | POA: Insufficient documentation

## 2015-04-25 DIAGNOSIS — S0101XA Laceration without foreign body of scalp, initial encounter: Secondary | ICD-10-CM

## 2015-04-25 DIAGNOSIS — Y9389 Activity, other specified: Secondary | ICD-10-CM | POA: Insufficient documentation

## 2015-04-25 DIAGNOSIS — Y92002 Bathroom of unspecified non-institutional (private) residence single-family (private) house as the place of occurrence of the external cause: Secondary | ICD-10-CM | POA: Insufficient documentation

## 2015-04-25 DIAGNOSIS — S20211A Contusion of right front wall of thorax, initial encounter: Secondary | ICD-10-CM

## 2015-04-25 LAB — CBC
HCT: 47.8 % (ref 40.0–52.0)
HEMOGLOBIN: 16.6 g/dL (ref 13.0–18.0)
MCH: 33.8 pg (ref 26.0–34.0)
MCHC: 34.8 g/dL (ref 32.0–36.0)
MCV: 96.9 fL (ref 80.0–100.0)
Platelets: 244 10*3/uL (ref 150–440)
RBC: 4.93 MIL/uL (ref 4.40–5.90)
RDW: 14 % (ref 11.5–14.5)
WBC: 11.6 10*3/uL — ABNORMAL HIGH (ref 3.8–10.6)

## 2015-04-25 LAB — COMPREHENSIVE METABOLIC PANEL
ALK PHOS: 104 U/L (ref 38–126)
ALT: 19 U/L (ref 17–63)
AST: 20 U/L (ref 15–41)
Albumin: 4 g/dL (ref 3.5–5.0)
Anion gap: 7 (ref 5–15)
BUN: 8 mg/dL (ref 6–20)
CHLORIDE: 103 mmol/L (ref 101–111)
CO2: 27 mmol/L (ref 22–32)
CREATININE: 0.82 mg/dL (ref 0.61–1.24)
Calcium: 8.5 mg/dL — ABNORMAL LOW (ref 8.9–10.3)
GFR calc non Af Amer: >60 mL/min (ref >60)
Glucose, Bld: 116 mg/dL — ABNORMAL HIGH (ref 65–99)
POTASSIUM: 4.7 mmol/L (ref 3.5–5.1)
SODIUM: 137 mmol/L (ref 135–145)
Total Bilirubin: 0.2 mg/dL — ABNORMAL LOW (ref 0.3–1.2)
Total Protein: 7.7 g/dL (ref 6.5–8.1)

## 2015-04-25 LAB — ETHANOL: Alcohol, Ethyl (B): 320 mg/dL (ref <5)

## 2015-04-25 MED ORDER — LIDOCAINE HCL (PF) 1 % IJ SOLN: 15.0000 mL | Freq: Once | INTRAMUSCULAR | Status: DC

## 2015-04-25 MED ORDER — LIDOCAINE HCL (PF) 1 % IJ SOLN
INTRAMUSCULAR | Status: AC
  Administered 2015-04-25: 04:00:00
  Filled 2015-04-25: qty 15

## 2015-04-25 MED ORDER — MORPHINE SULFATE (PF) 2 MG/ML IV SOLN
2.0000 mg | Freq: Once | INTRAVENOUS | Status: AC
  Administered 2015-04-25: 2 mg via INTRAVENOUS
  Filled 2015-04-25: qty 1

## 2015-04-25 MED ORDER — ACETAMINOPHEN 325 MG PO TABS
ORAL_TABLET | ORAL | Status: AC
  Filled 2015-04-25: qty 2

## 2015-04-25 NOTE — ED Notes (Signed)
Dr. Manson Passey made aware of ETOH level called from lab

## 2015-04-25 NOTE — ED Notes (Signed)
Dr. Manson Passey at bedside pt has 4 staples on head

## 2015-04-25 NOTE — ED Notes (Signed)
Pt continues to come out into hallway and asking to walk home. Dr Manson Passey in to speak with pt and explain to him that his alcohol level is too high for him to leave at this time. Pt agrees to return to his room.

## 2015-04-25 NOTE — ED Notes (Signed)
Unidentified male called asking about pt's medical conditions informed that RN is not able to disclose any information over the phone without pt's consent unidentified male hugh p phone
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
## 2015-04-25 NOTE — ED Notes (Signed)
Report received on pt. Care assumed. Pt walking in hallway asking to go outside to smoke. Explained to pt that the campus is smoke free and that the doctor wants him to wait in his room until a ride comes to take him home. Pt reports he has called his wife to pick him up.

## 2015-04-25 NOTE — ED Notes (Signed)
Pt asking to have his blood level of alcohol rechecked so he can go home.

## 2015-04-25 NOTE — ED Notes (Signed)
Pt speaking to wife finding out if she can come to pick him up

## 2015-04-25 NOTE — ED Notes (Signed)
Pt presents to the ER via EMS from home with complaints of right rib pain, head laceration. Per EMS reports pt fell in the [place]. Pt verbalized to RN he fell in the bathroom. Pt has laceration to head, bleeding controlled, bruce  to back, and right rib. Pt intoxicated. Awake , alert

## 2015-04-25 NOTE — Discharge Instructions (Signed)
Alcohol Intoxication Alcohol intoxication occurs when the amount of alcohol that a person has consumed impairs his or her ability to mentally and physically function. Alcohol directly impairs the normal chemical activity of the brain. Drinking large amounts of alcohol can lead to changes in mental function and behavior, and it can cause many physical effects that can be harmful.  Alcohol intoxication can range in severity from mild to very severe. Various factors can affect the level of intoxication that occurs, such as the person's age, gender, weight, frequency of alcohol consumption, and the presence of other medical conditions (such as diabetes, seizures, or heart conditions). Dangerous levels of alcohol intoxication may occur when people drink large amounts of alcohol in a short period (binge drinking). Alcohol can also be especially dangerous when combined with certain prescription medicines or "recreational" drugs. SIGNS AND SYMPTOMS Some common signs and symptoms of mild alcohol intoxication include:  Loss of coordination.  Changes in mood and behavior.  Impaired judgment.  Slurred speech. As alcohol intoxication progresses to more severe levels, other signs and symptoms will appear. These may include:  Vomiting.  Confusion and impaired memory.  Slowed breathing.  Seizures.  Loss of consciousness. DIAGNOSIS  Your health care provider will take a medical history and perform a physical exam. You will be asked about the amount and type of alcohol you have consumed. Blood tests will be done to measure the concentration of alcohol in your blood. In many places, your blood alcohol level must be lower than 80 mg/dL (1.61%) to legally drive. However, many dangerous effects of alcohol can occur at much lower levels.  TREATMENT  People with alcohol intoxication often do not require treatment. Most of the effects of alcohol intoxication are temporary, and they go away as the alcohol naturally  leaves the body. Your health care provider will monitor your condition until you are stable enough to go home. Fluids are sometimes given through an IV access tube to help prevent dehydration.  HOME CARE INSTRUCTIONS  Do not drive after drinking alcohol.  Stay hydrated. Drink enough water and fluids to keep your urine clear or pale yellow. Avoid caffeine.   Only take over-the-counter or prescription medicines as directed by your health care provider.  SEEK MEDICAL CARE IF:   You have persistent vomiting.   You do not feel better after a few days.  You have frequent alcohol intoxication. Your health care provider can help determine if you should see a substance use treatment counselor. SEEK IMMEDIATE MEDICAL CARE IF:   You become shaky or tremble when you try to stop drinking.   You shake uncontrollably (seizure).   You throw up (vomit) blood. This may be bright red or may look like black coffee grounds.   You have blood in your stool. This may be bright red or may appear as a black, tarry, bad smelling stool.   You become lightheaded or faint.  MAKE SURE YOU:   Understand these instructions.  Will watch your condition.  Will get help right away if you are not doing well or get worse.   This information is not intended to replace advice given to you by your health care provider. Make sure you discuss any questions you have with your health care provider.   Document Released: 12/08/2004 Document Revised: 10/31/2012 Document Reviewed: 08/03/2012 Elsevier Interactive Patient Education 2016 Elsevier Inc[employer]  Chest Contusion A chest contusion is a deep bruise on your chest area. Contusions are the result of an injury that  caused bleeding under the skin. A chest contusion may involve bruising of the skin, muscles, or ribs. The contusion may turn blue, purple, or yellow. Minor injuries will give you a painless contusion, but more severe contusions may stay painful and swollen  for a few weeks. CAUSES  A contusion is usually caused by a blow, trauma, or direct force to an area of the body. SYMPTOMS   Swelling and redness of the injured area.  Discoloration of the injured area.  Tenderness and soreness of the injured area.  Pain. DIAGNOSIS  The diagnosis can be made by taking a history and performing a physical exam. An X-ray, CT scan, or MRI may be needed to determine if there were any associated injuries, such as broken bones (fractures) or internal injuries. TREATMENT  Often, the best treatment for a chest contusion is resting, icing, and applying cold compresses to the injured area. Deep breathing exercises may be recommended to reduce the risk of pneumonia. Over-the-counter medicines may also be recommended for pain control. HOME CARE INSTRUCTIONS   Put ice on the injured area.  Put ice in a plastic bag.  Place a towel between your skin and the bag.  Leave the ice on for 15-20 minutes, 03-04 times a day.  Only take over-the-counter or prescription medicines as directed by your caregiver. Your caregiver may recommend avoiding anti-inflammatory medicines (aspirin, ibuprofen, and naproxen) for 48 hours because these medicines may increase bruising.  Rest the injured area.  Perform deep-breathing exercises as directed by your caregiver.  Stop smoking if you smoke.  Do not lift objects over 5 pounds (2.3 kg) for 3 days or longer if recommended by your caregiver. SEEK IMMEDIATE MEDICAL CARE IF:   You have increased bruising or swelling.  You have pain that is getting worse.  You have difficulty breathing.  You have dizziness, weakness, or fainting.  You have blood in your urine or stool.  You cough up or vomit blood.  Your swelling or pain is not relieved with medicines. MAKE SURE YOU:   Understand these instructions.  Will watch your condition.  Will get help right away if you are not doing well or get worse.   This information is  not intended to replace advice given to you by your health care provider. Make sure you discuss any questions you have with your health care provider.  Stitches, Staples, or Adhesive Wound Closure PLEASE HAVE STAPLES REMOVED IN 7 DAYS Health care providers use stitches (sutures), staples, and certain glue (skin adhesives) to hold skin together while it heals (wound closure). You may need this treatment after you have surgery or if you cut your skin accidentally. These methods help your skin to heal more quickly and make it less likely that you will have a scar. A wound may take several months to heal completely. The type of wound you have determines when your wound gets closed. In most cases, the wound is closed as soon as possible (primary skin closure). Sometimes, closure is delayed so the wound can be cleaned and allowed to heal naturally. This reduces the chance of infection. Delayed closure may be needed if your wound:  Is caused by a bite.  Happened more than 6 hours ago.  Involves loss of skin or the tissues under the skin.  Has dirt or debris in it that cannot be removed.  Is infected. WHAT ARE THE DIFFERENT KINDS OF WOUND CLOSURES? There are many options for wound closure. The one that your health  care provider uses depends on how deep and how large your wound is. Adhesive Glue To use this type of glue to close a wound, your health care provider holds the edges of the wound together and paints the glue on the surface of your skin. You may need more than one layer of glue. Then the wound may be covered with a light bandage (dressing). This type of skin closure may be used for small wounds that are not deep (superficial). Using glue for wound closure is less painful than other methods. It does not require a medicine that numbs the area (local anesthetic). This method also leaves nothing to be removed. Adhesive glue is often used for children and on facial wounds. Adhesive glue cannot be  used for wounds that are deep, uneven, or bleeding. It is not used inside of a wound.  Adhesive Strips These strips are made of sticky (adhesive), porous paper. They are applied across your skin edges like a regular adhesive bandage. You leave them on until they fall off. Adhesive strips may be used to close very superficial wounds. They may also be used along with sutures to improve the closure of your skin edges.  Sutures Sutures are the oldest method of wound closure. Sutures can be made from natural substances, such as silk, or from synthetic materials, such as nylon and steel. They can be made from a material that your body can break down as your wound heals (absorbable), or they can be made from a material that needs to be removed from your skin (nonabsorbable). They come in many different strengths and sizes. Your health care provider attaches the sutures to a steel needle on one end. Sutures can be passed through your skin, or through the tissues beneath your skin. Then they are tied and cut. Your skin edges may be closed in one continuous stitch or in separate stitches. Sutures are strong and can be used for all kinds of wounds. Absorbable sutures may be used to close tissues under the skin. The disadvantage of sutures is that they may cause skin reactions that lead to infection. Nonabsorbable sutures need to be removed. Staples When surgical staples are used to close a wound, the edges of your skin on both sides of the wound are brought close together. A staple is placed across the wound, and an instrument secures the edges together. Staples are often used to close surgical cuts (incisions). Staples are faster to use than sutures, and they cause less skin reaction. Staples need to be removed using a tool that bends the staples away from your skin. HOW DO I CARE FOR MY WOUND CLOSURE?  Take medicines only as directed by your health care provider.  If you were prescribed an antibiotic medicine  for your wound, finish it all even if you start to feel better.  Use ointments or creams only as directed by your health care provider.  Wash your hands with soap and water before and after touching your wound.  Do not soak your wound in water. Do not take baths, swim, or use a hot tub until your health care provider approves.  Ask your health care provider when you can start showering. Cover your wound if directed by your health care provider.  Do not take out your own sutures or staples.  Do not pick at your wound. Picking can cause an infection.  Keep all follow-up visits as directed by your health care provider. This is important. HOW LONG WILL I HAVE  MY WOUND CLOSURE?  Leave adhesive glue on your skin until the glue peels away.  Leave adhesive strips on your skin until the strips fall off.  Absorbable sutures will dissolve within several days.  Nonabsorbable sutures and staples must be removed. The location of the wound will determine how long they stay in. This can range from several days to a couple of weeks. WHEN SHOULD I SEEK HELP FOR MY WOUND CLOSURE? Contact your health care provider if:  You have a fever.  You have chills.  You have drainage, redness, swelling, or pain at your wound.  There is a bad smell coming from your wound.  The skin edges of your wound start to separate after your sutures have been removed.  Your wound becomes thick, raised, and darker in color after your sutures come out (scarring).   This information is not intended to replace advice given to you by your health care provider. Make sure you discuss any questions you have with your health care provider.   Document Released: 11/23/2000 Document Revised: 03/21/2014 Document Reviewed: 08/07/2013 Elsevier Interactive Patient Education 2016 Elsevier Inc.   Document Released: 11/23/2000 Document Revised: 11/23/2011 Document Reviewed: 08/22/2011 Elsevier Interactive Patient Education [employer].

## 2015-04-25 NOTE — ED Notes (Signed)
Pt. Given taxi voucher 

## 2017-04-30 IMAGING — CR DG ELBOW COMPLETE 3+V*L*
4 series · 4 of 4 positions shown · non-contrast
Comparison: None.

CLINICAL DATA: Fell off ladder from 10 foot height yesterday. Left
elbow pain and swelling. Initial encounter.

EXAM:
LEFT ELBOW - COMPLETE 3+ VIEW

[elbow ap]
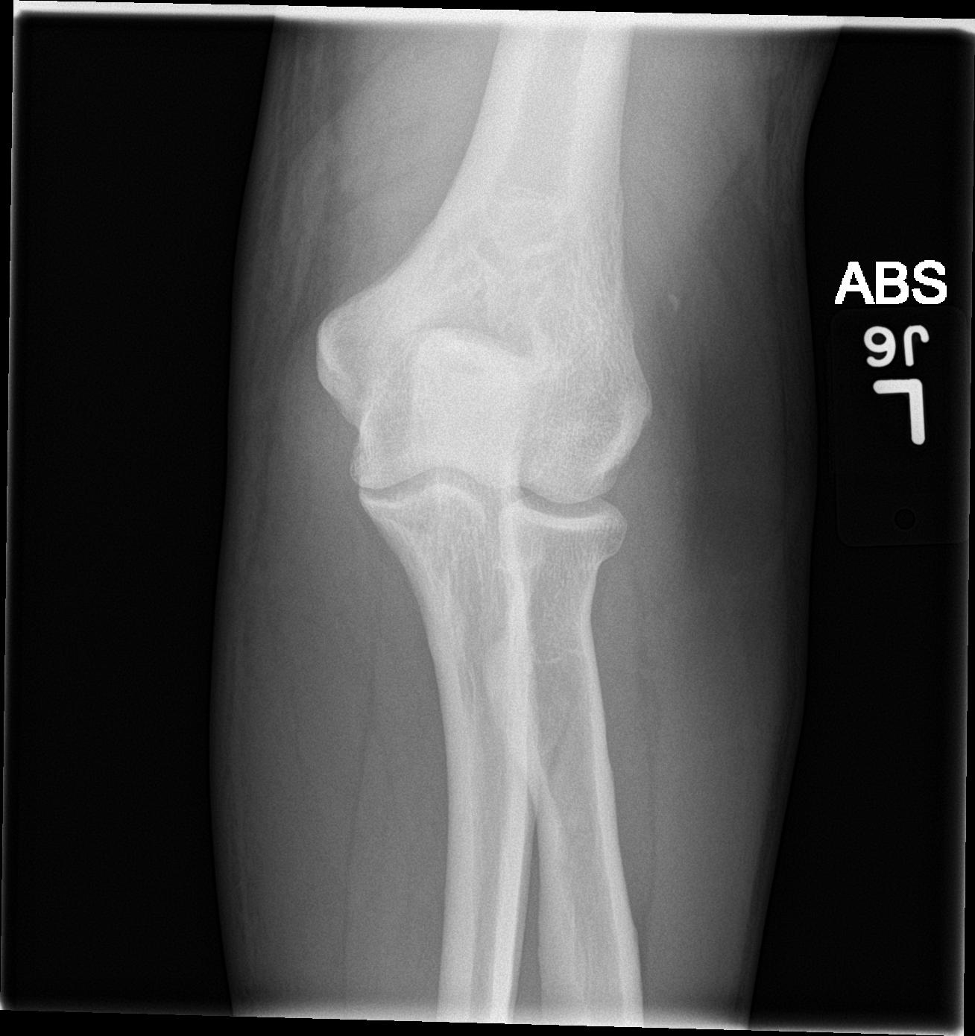

[elbow obl (1 of 2)]
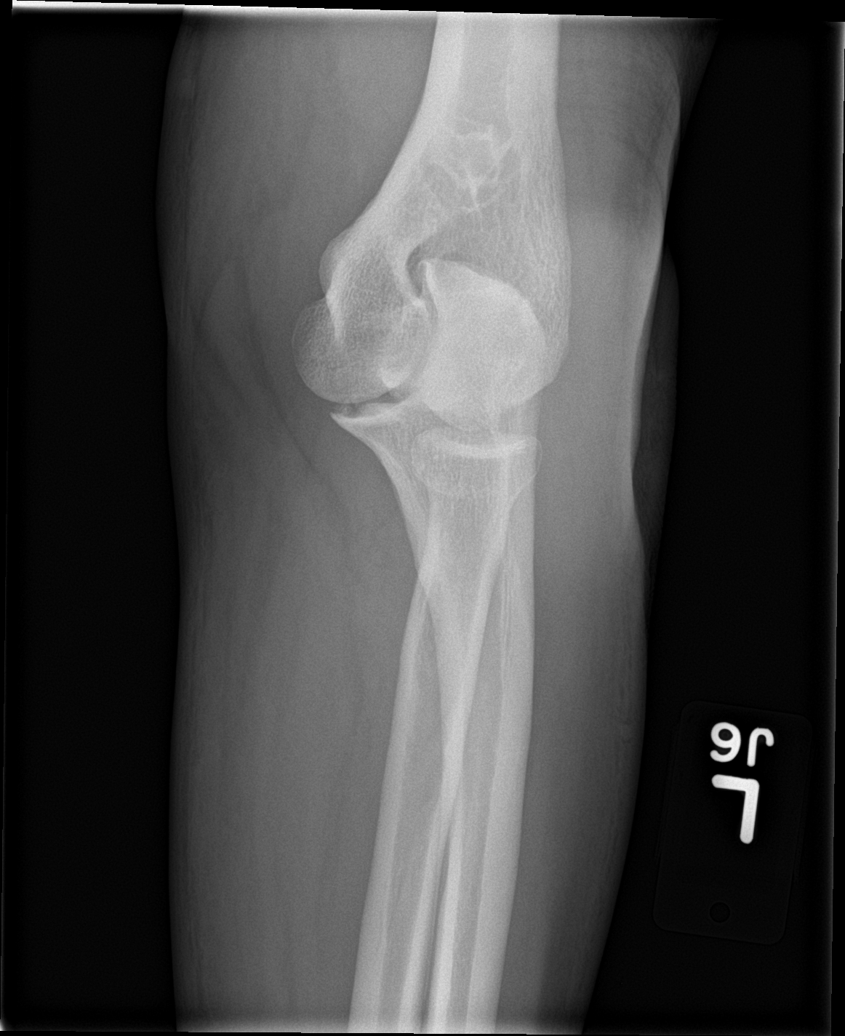

[elbow obl (2 of 2)]
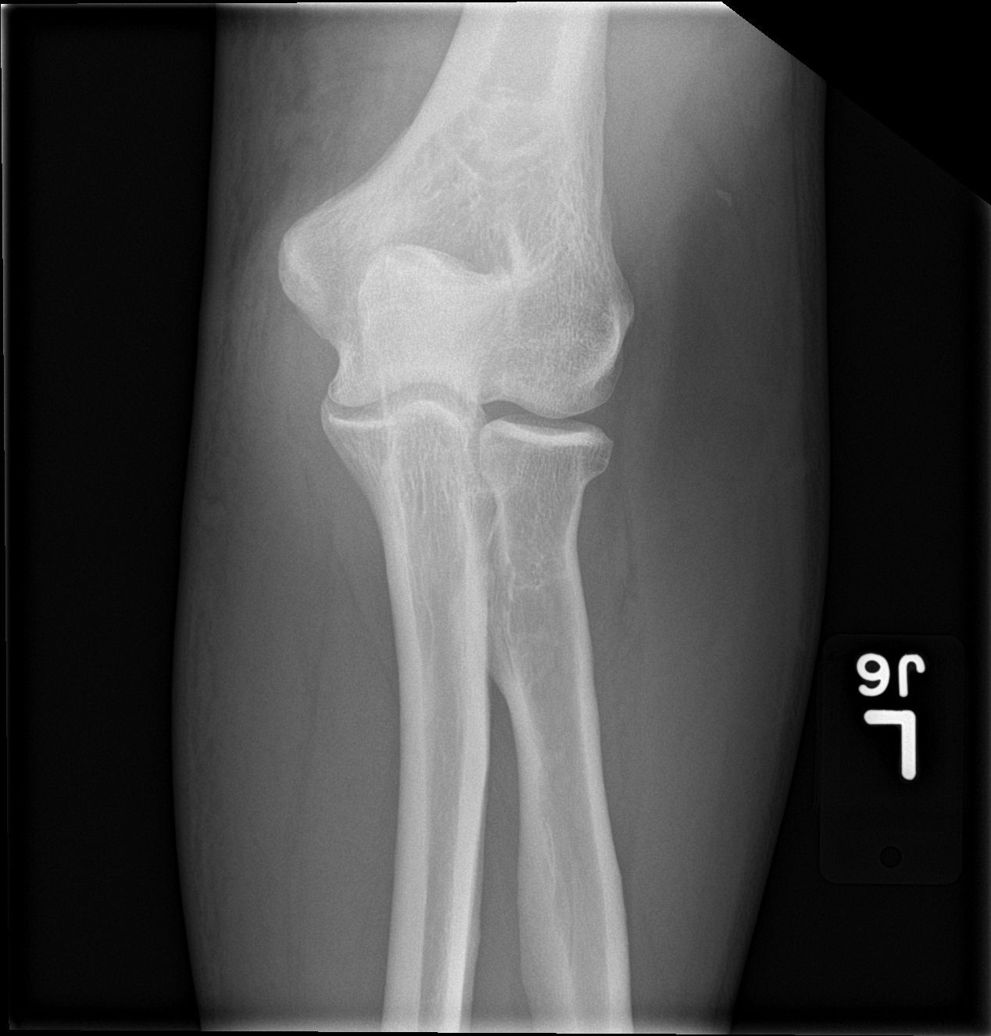

[elbow lat]
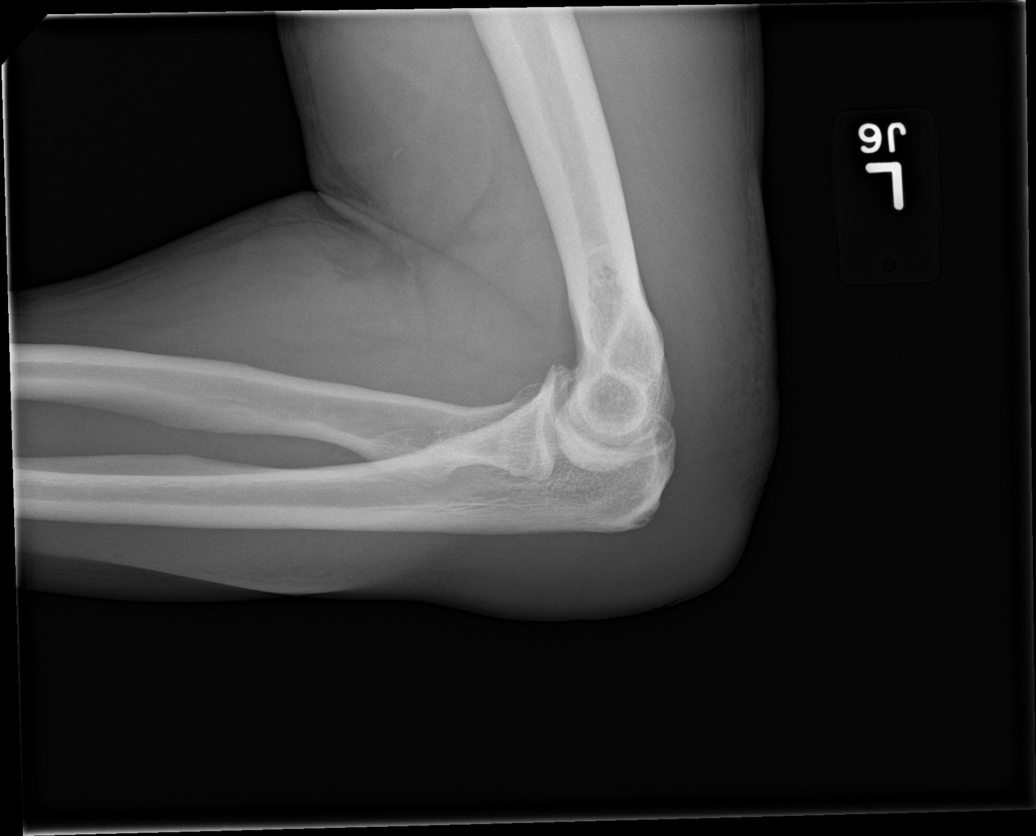

[4 of 4 positions shown; findings below may reference images not displayed]

FINDINGS: There is no evidence of fracture, dislocation, or joint effusion.
Mild degenerative spurring seen involving the ulna. No other
significant bone abnormality identified. Prominent dorsal soft
tissue swelling is seen.
IMPRESSION: Prominent dorsal soft tissue swelling. No evidence of fracture,
dislocation, or joint effusion.

## 2017-04-30 IMAGING — CR DG FOREARM 2V*L*
2 series · 2 of 2 positions shown · non-contrast
Comparison: None.

CLINICAL DATA: Pain following fall from ladder

EXAM:
LEFT FOREARM - 2 VIEW

[forearm ap]
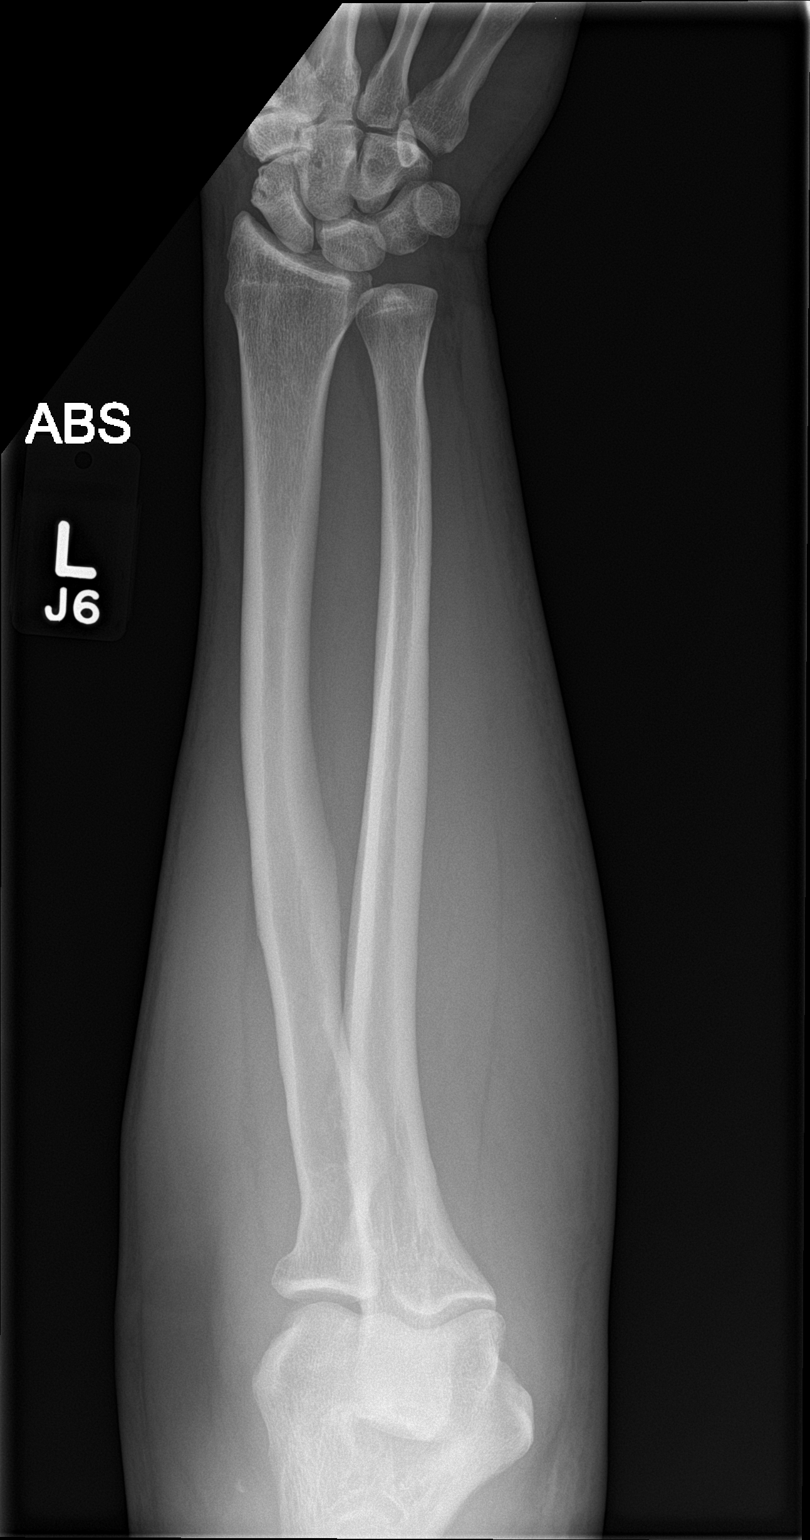

[forearm lat]
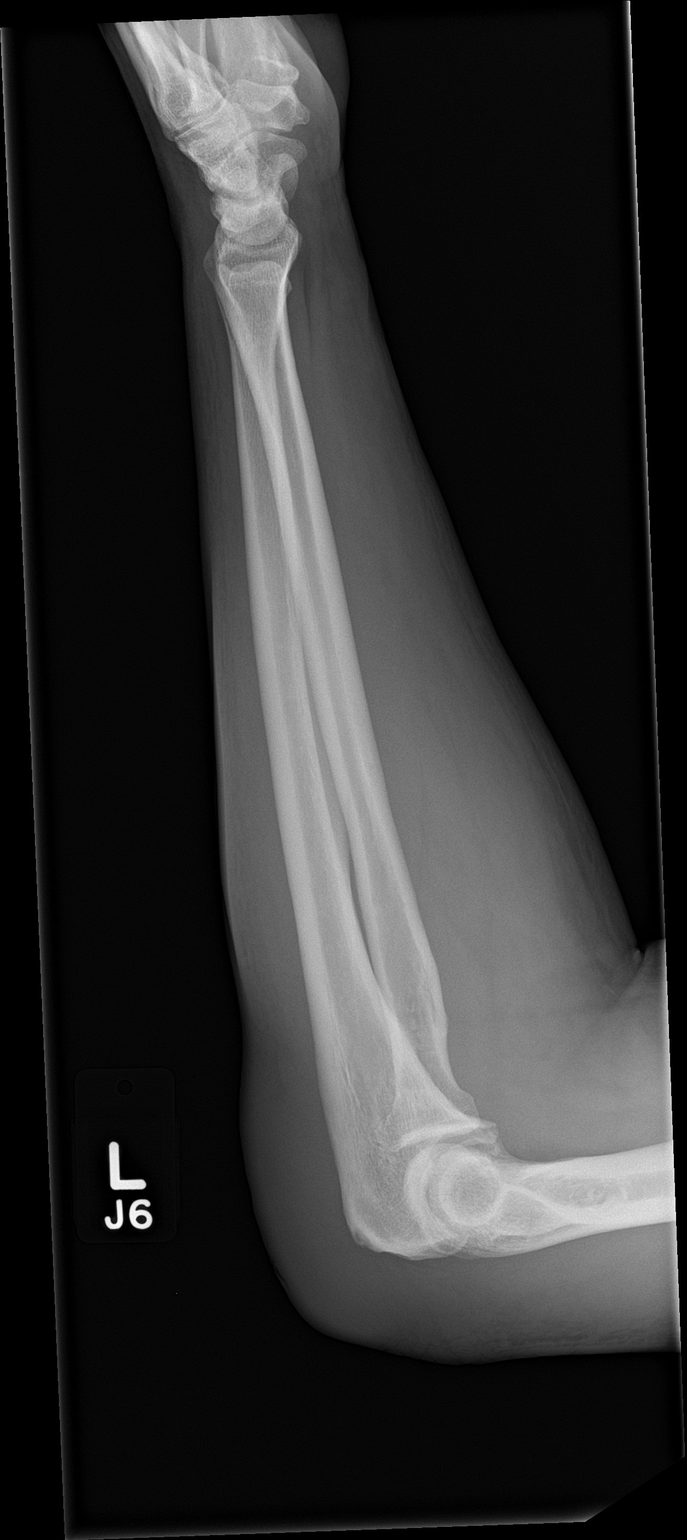

[2 of 2 positions shown; findings below may reference images not displayed]

FINDINGS: Frontal and lateral views were obtained. There is a lucency in the
distal scaphoid on the frontal view. No other findings suggesting
potential fracture identified. No dislocation. No appreciable elbow
joint effusion. There is mild osteoarthritic change in the elbow
joint. Incidental note is made of a minus ulnar variant.
IMPRESSION: Lucency in the distal scaphoid bone ; advise dedicated wrist series
to further evaluate this finding. Elsewhere no evidence to suggest
fracture or dislocation. Mild osteoarthritic change in the elbow
joint.

## 2017-06-28 DIAGNOSIS — E7800 Pure hypercholesterolemia, unspecified: Secondary | ICD-10-CM | POA: Diagnosis not present

## 2017-06-28 DIAGNOSIS — I10 Essential (primary) hypertension: Secondary | ICD-10-CM | POA: Diagnosis not present

## 2017-06-28 DIAGNOSIS — R011 Cardiac murmur, unspecified: Secondary | ICD-10-CM | POA: Diagnosis not present

## 2017-06-28 DIAGNOSIS — I2510 Atherosclerotic heart disease of native coronary artery without angina pectoris: Secondary | ICD-10-CM | POA: Diagnosis not present

## 2017-11-17 ENCOUNTER — Encounter: Payer: Self-pay | Admitting: Emergency Medicine

## 2017-11-17 ENCOUNTER — Emergency Department: Payer: PRIVATE HEALTH INSURANCE

## 2017-11-17 ENCOUNTER — Emergency Department
Admission: EM | Admit: 2017-11-17 | Discharge: 2017-11-17 | Disposition: A | Discharge status: Home / Self Care | Payer: PRIVATE HEALTH INSURANCE | Attending: Emergency Medicine | Admitting: Emergency Medicine

## 2017-11-17 ENCOUNTER — Other Ambulatory Visit: Payer: Self-pay

## 2017-11-17 DIAGNOSIS — Y9389 Activity, other specified: Secondary | ICD-10-CM | POA: Insufficient documentation

## 2017-11-17 DIAGNOSIS — T07XXXA Unspecified multiple injuries, initial encounter: Secondary | ICD-10-CM

## 2017-11-17 DIAGNOSIS — I10 Essential (primary) hypertension: Secondary | ICD-10-CM | POA: Insufficient documentation

## 2017-11-17 DIAGNOSIS — S2242XA Multiple fractures of ribs, left side, initial encounter for closed fracture: Secondary | ICD-10-CM

## 2017-11-17 DIAGNOSIS — Z79899 Other long term (current) drug therapy: Secondary | ICD-10-CM | POA: Insufficient documentation

## 2017-11-17 DIAGNOSIS — Y999 Unspecified external cause status: Secondary | ICD-10-CM | POA: Insufficient documentation

## 2017-11-17 DIAGNOSIS — T148XXA Other injury of unspecified body region, initial encounter: Secondary | ICD-10-CM | POA: Insufficient documentation

## 2017-11-17 DIAGNOSIS — F17210 Nicotine dependence, cigarettes, uncomplicated: Secondary | ICD-10-CM | POA: Insufficient documentation

## 2017-11-17 DIAGNOSIS — I2510 Atherosclerotic heart disease of native coronary artery without angina pectoris: Secondary | ICD-10-CM | POA: Insufficient documentation

## 2017-11-17 DIAGNOSIS — I252 Old myocardial infarction: Secondary | ICD-10-CM | POA: Insufficient documentation

## 2017-11-17 DIAGNOSIS — S2241XA Multiple fractures of ribs, right side, initial encounter for closed fracture: Secondary | ICD-10-CM | POA: Insufficient documentation

## 2017-11-17 DIAGNOSIS — Y92410 Unspecified street and highway as the place of occurrence of the external cause: Secondary | ICD-10-CM | POA: Insufficient documentation

## 2017-11-17 MED ORDER — OXYCODONE-ACETAMINOPHEN 10-325 MG PO TABS: 1.0000 | ORAL_TABLET | Freq: Four times a day (QID) | ORAL | 0 refills | Status: DC | PRN

## 2017-11-17 MED ORDER — OXYCODONE-ACETAMINOPHEN 5-325 MG PO TABS
1.0000 | ORAL_TABLET | Freq: Once | ORAL | Status: AC
  Administered 2017-11-17: 1 via ORAL
  Filled 2017-11-17: qty 1
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
## 2017-11-17 NOTE — Discharge Instructions (Signed)
Follow-up with your primary care provider or [facility] acute care if any continued problems.  Take medication only as directed and do not drive or operate machinery while taking the pain medication as it could cause drowsiness and increase your risk for injury.  Use a pillow to your ribs if needed for cough or deep breathing.  Continue to take deep breaths to prevent pneumonia.  Clean abrasions daily with mild soap and water to prevent infection.  Watch for any signs of infection such as pus or fever.
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
## 2017-11-29 DIAGNOSIS — S2242XA Multiple fractures of ribs, left side, initial encounter for closed fracture: Secondary | ICD-10-CM | POA: Diagnosis not present

## 2017-12-13 DIAGNOSIS — I208 Other forms of angina pectoris: Secondary | ICD-10-CM | POA: Diagnosis not present

## 2017-12-13 DIAGNOSIS — I2510 Atherosclerotic heart disease of native coronary artery without angina pectoris: Secondary | ICD-10-CM | POA: Diagnosis not present

## 2017-12-13 DIAGNOSIS — E7800 Pure hypercholesterolemia, unspecified: Secondary | ICD-10-CM | POA: Diagnosis not present

## 2017-12-13 DIAGNOSIS — R011 Cardiac murmur, unspecified: Secondary | ICD-10-CM | POA: Diagnosis not present

## 2018-01-04 DIAGNOSIS — F419 Anxiety disorder, unspecified: Secondary | ICD-10-CM | POA: Diagnosis not present

## 2018-01-04 DIAGNOSIS — E669 Obesity, unspecified: Secondary | ICD-10-CM | POA: Diagnosis not present

## 2018-01-04 DIAGNOSIS — F329 Major depressive disorder, single episode, unspecified: Secondary | ICD-10-CM | POA: Diagnosis not present

## 2018-01-04 DIAGNOSIS — I2510 Atherosclerotic heart disease of native coronary artery without angina pectoris: Secondary | ICD-10-CM | POA: Diagnosis not present

## 2018-03-22 DIAGNOSIS — I252 Old myocardial infarction: Secondary | ICD-10-CM | POA: Diagnosis not present

## 2018-03-22 DIAGNOSIS — Z720 Tobacco use: Secondary | ICD-10-CM | POA: Diagnosis not present

## 2018-03-22 DIAGNOSIS — I2510 Atherosclerotic heart disease of native coronary artery without angina pectoris: Secondary | ICD-10-CM | POA: Diagnosis not present

## 2018-03-24 ENCOUNTER — Other Ambulatory Visit: Payer: Self-pay

## 2018-03-24 ENCOUNTER — Encounter: Payer: Self-pay | Admitting: Emergency Medicine

## 2018-03-24 ENCOUNTER — Emergency Department
Admission: EM | Admit: 2018-03-24 | Discharge: 2018-03-24 | Disposition: A | Discharge status: Home / Self Care | Payer: PRIVATE HEALTH INSURANCE | Attending: Emergency Medicine | Admitting: Emergency Medicine

## 2018-03-24 DIAGNOSIS — K529 Noninfective gastroenteritis and colitis, unspecified: Secondary | ICD-10-CM | POA: Insufficient documentation

## 2018-03-24 DIAGNOSIS — A059 Bacterial foodborne intoxication, unspecified: Secondary | ICD-10-CM | POA: Insufficient documentation

## 2018-03-24 DIAGNOSIS — Z79899 Other long term (current) drug therapy: Secondary | ICD-10-CM | POA: Insufficient documentation

## 2018-03-24 DIAGNOSIS — I2510 Atherosclerotic heart disease of native coronary artery without angina pectoris: Secondary | ICD-10-CM | POA: Insufficient documentation

## 2018-03-24 DIAGNOSIS — I10 Essential (primary) hypertension: Secondary | ICD-10-CM | POA: Insufficient documentation

## 2018-03-24 DIAGNOSIS — E860 Dehydration: Secondary | ICD-10-CM | POA: Insufficient documentation

## 2018-03-24 DIAGNOSIS — F17210 Nicotine dependence, cigarettes, uncomplicated: Secondary | ICD-10-CM | POA: Insufficient documentation

## 2018-03-24 LAB — COMPREHENSIVE METABOLIC PANEL
ALT: 25 U/L (ref 0–44)
ANION GAP: 6 (ref 5–15)
AST: 20 U/L (ref 15–41)
Albumin: 4.3 g/dL (ref 3.5–5.0)
Alkaline Phosphatase: 72 U/L (ref 38–126)
BUN: 16 mg/dL (ref 6–20)
CALCIUM: 9 mg/dL (ref 8.9–10.3)
CO2: 26 mmol/L (ref 22–32)
Chloride: 110 mmol/L (ref 98–111)
Creatinine, Ser: 0.97 mg/dL (ref 0.61–1.24)
GFR calc non Af Amer: >60 mL/min (ref >60)
GLUCOSE: 118 mg/dL — AB (ref 70–99)
POTASSIUM: 4.2 mmol/L (ref 3.5–5.1)
Sodium: 142 mmol/L (ref 135–145)
Total Bilirubin: 0.5 mg/dL (ref 0.3–1.2)
Total Protein: 7 g/dL (ref 6.5–8.1)

## 2018-03-24 LAB — URINALYSIS, COMPLETE (UACMP) WITH MICROSCOPIC
BILIRUBIN URINE: NEGATIVE
Bacteria, UA: NONE SEEN
GLUCOSE, UA: NEGATIVE mg/dL
HGB URINE DIPSTICK: NEGATIVE
KETONES UR: NEGATIVE mg/dL
LEUKOCYTES UA: NEGATIVE
Nitrite: NEGATIVE
PH: 5 (ref 5.0–8.0)
PROTEIN: NEGATIVE mg/dL
Specific Gravity, Urine: 1.026 (ref 1.005–1.030)

## 2018-03-24 LAB — CBC
HCT: 42.8 % (ref 39.0–52.0)
HEMOGLOBIN: 14.4 g/dL (ref 13.0–17.0)
MCH: 31.4 pg (ref 26.0–34.0)
MCHC: 33.6 g/dL (ref 30.0–36.0)
MCV: 93.2 fL (ref 80.0–100.0)
NRBC: 0 % (ref 0.0–0.2)
PLATELETS: 163 10*3/uL (ref 150–400)
RBC: 4.59 MIL/uL (ref 4.22–5.81)
RDW: 13.2 % (ref 11.5–15.5)
WBC: 8.1 10*3/uL (ref 4.0–10.5)

## 2018-03-24 LAB — LIPASE, BLOOD: Lipase: 33 U/L (ref 11–51)

## 2018-03-24 MED ORDER — ONDANSETRON 4 MG PO TBDP: 4.0000 mg | ORAL_TABLET | Freq: Three times a day (TID) | ORAL | 0 refills | Status: DC | PRN

## 2018-03-24 MED ORDER — CIPROFLOXACIN HCL 500 MG PO TABS: 500.0000 mg | ORAL_TABLET | Freq: Two times a day (BID) | ORAL | 0 refills | Status: AC
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
## 2018-03-24 NOTE — ED Triage Notes (Signed)
C/O N/V/D x 9 days.  States symptoms started after eating [place] food.

## 2018-04-25 DIAGNOSIS — I700 Atherosclerosis of aorta: Secondary | ICD-10-CM | POA: Diagnosis not present

## 2018-04-25 DIAGNOSIS — I38 Endocarditis, valve unspecified: Secondary | ICD-10-CM | POA: Diagnosis not present

## 2018-04-25 DIAGNOSIS — E785 Hyperlipidemia, unspecified: Secondary | ICD-10-CM | POA: Diagnosis not present

## 2018-04-25 DIAGNOSIS — I119 Hypertensive heart disease without heart failure: Secondary | ICD-10-CM | POA: Diagnosis not present

## 2018-05-08 DIAGNOSIS — I700 Atherosclerosis of aorta: Secondary | ICD-10-CM | POA: Diagnosis not present

## 2018-05-08 DIAGNOSIS — E669 Obesity, unspecified: Secondary | ICD-10-CM | POA: Diagnosis not present

## 2018-05-08 DIAGNOSIS — I252 Old myocardial infarction: Secondary | ICD-10-CM | POA: Diagnosis not present

## 2018-05-08 DIAGNOSIS — I350 Nonrheumatic aortic (valve) stenosis: Secondary | ICD-10-CM | POA: Diagnosis not present

## 2018-05-08 DIAGNOSIS — I38 Endocarditis, valve unspecified: Secondary | ICD-10-CM | POA: Diagnosis not present

## 2018-05-17 DIAGNOSIS — I38 Endocarditis, valve unspecified: Secondary | ICD-10-CM | POA: Diagnosis not present

## 2018-05-17 DIAGNOSIS — I119 Hypertensive heart disease without heart failure: Secondary | ICD-10-CM | POA: Diagnosis not present

## 2018-05-17 DIAGNOSIS — I252 Old myocardial infarction: Secondary | ICD-10-CM | POA: Diagnosis not present

## 2018-05-17 DIAGNOSIS — I350 Nonrheumatic aortic (valve) stenosis: Secondary | ICD-10-CM | POA: Diagnosis not present

## 2018-06-21 DIAGNOSIS — E669 Obesity, unspecified: Secondary | ICD-10-CM | POA: Diagnosis not present

## 2018-06-21 DIAGNOSIS — I350 Nonrheumatic aortic (valve) stenosis: Secondary | ICD-10-CM | POA: Diagnosis not present

## 2018-06-21 DIAGNOSIS — I119 Hypertensive heart disease without heart failure: Secondary | ICD-10-CM | POA: Diagnosis not present

## 2018-06-21 DIAGNOSIS — I252 Old myocardial infarction: Secondary | ICD-10-CM | POA: Diagnosis not present

## 2018-07-20 DIAGNOSIS — I38 Endocarditis, valve unspecified: Secondary | ICD-10-CM | POA: Diagnosis not present

## 2018-07-20 DIAGNOSIS — I350 Nonrheumatic aortic (valve) stenosis: Secondary | ICD-10-CM | POA: Diagnosis not present

## 2018-07-20 DIAGNOSIS — F419 Anxiety disorder, unspecified: Secondary | ICD-10-CM | POA: Diagnosis not present

## 2018-07-20 DIAGNOSIS — E785 Hyperlipidemia, unspecified: Secondary | ICD-10-CM | POA: Diagnosis not present

## 2018-07-30 DIAGNOSIS — I252 Old myocardial infarction: Secondary | ICD-10-CM | POA: Diagnosis not present

## 2018-07-30 DIAGNOSIS — Z720 Tobacco use: Secondary | ICD-10-CM | POA: Diagnosis not present

## 2018-07-30 DIAGNOSIS — I700 Atherosclerosis of aorta: Secondary | ICD-10-CM | POA: Diagnosis not present

## 2018-07-30 DIAGNOSIS — F419 Anxiety disorder, unspecified: Secondary | ICD-10-CM | POA: Diagnosis not present

## 2018-08-17 DIAGNOSIS — F329 Major depressive disorder, single episode, unspecified: Secondary | ICD-10-CM | POA: Diagnosis not present

## 2018-08-17 DIAGNOSIS — F419 Anxiety disorder, unspecified: Secondary | ICD-10-CM | POA: Diagnosis not present

## 2018-08-17 DIAGNOSIS — I119 Hypertensive heart disease without heart failure: Secondary | ICD-10-CM | POA: Diagnosis not present

## 2018-08-17 DIAGNOSIS — I350 Nonrheumatic aortic (valve) stenosis: Secondary | ICD-10-CM | POA: Diagnosis not present

## 2018-09-17 DIAGNOSIS — E785 Hyperlipidemia, unspecified: Secondary | ICD-10-CM | POA: Diagnosis not present

## 2018-09-17 DIAGNOSIS — I252 Old myocardial infarction: Secondary | ICD-10-CM | POA: Diagnosis not present

## 2018-09-17 DIAGNOSIS — I350 Nonrheumatic aortic (valve) stenosis: Secondary | ICD-10-CM | POA: Diagnosis not present

## 2018-09-17 DIAGNOSIS — Z720 Tobacco use: Secondary | ICD-10-CM | POA: Diagnosis not present

## 2018-09-24 DIAGNOSIS — E785 Hyperlipidemia, unspecified: Secondary | ICD-10-CM | POA: Diagnosis not present

## 2018-09-24 DIAGNOSIS — I2510 Atherosclerotic heart disease of native coronary artery without angina pectoris: Secondary | ICD-10-CM | POA: Diagnosis not present

## 2018-09-24 DIAGNOSIS — I252 Old myocardial infarction: Secondary | ICD-10-CM | POA: Diagnosis not present

## 2018-09-24 DIAGNOSIS — Z720 Tobacco use: Secondary | ICD-10-CM | POA: Diagnosis not present

## 2018-10-17 DIAGNOSIS — I252 Old myocardial infarction: Secondary | ICD-10-CM | POA: Diagnosis not present

## 2018-10-17 DIAGNOSIS — Z720 Tobacco use: Secondary | ICD-10-CM | POA: Diagnosis not present

## 2018-10-17 DIAGNOSIS — E669 Obesity, unspecified: Secondary | ICD-10-CM | POA: Diagnosis not present

## 2018-10-17 DIAGNOSIS — E785 Hyperlipidemia, unspecified: Secondary | ICD-10-CM | POA: Diagnosis not present

## 2019-07-22 ENCOUNTER — Encounter: Payer: Self-pay | Admitting: Internal Medicine

## 2019-07-22 ENCOUNTER — Ambulatory Visit: Payer: 59 | Admitting: Internal Medicine

## 2019-07-22 ENCOUNTER — Other Ambulatory Visit: Payer: Self-pay

## 2019-07-22 ENCOUNTER — Other Ambulatory Visit: Payer: Self-pay | Admitting: Internal Medicine

## 2019-07-22 VITALS — BP 126/83 | Wt 188.7 lb

## 2019-07-22 DIAGNOSIS — E782 Mixed hyperlipidemia: Secondary | ICD-10-CM

## 2019-07-22 DIAGNOSIS — K219 Gastro-esophageal reflux disease without esophagitis: Secondary | ICD-10-CM | POA: Diagnosis not present

## 2019-07-22 DIAGNOSIS — F419 Anxiety disorder, unspecified: Secondary | ICD-10-CM

## 2019-07-22 DIAGNOSIS — I10 Essential (primary) hypertension: Secondary | ICD-10-CM

## 2019-07-22 MED ORDER — ALPRAZOLAM 0.5 MG PO TABS: 0.5000 mg | ORAL_TABLET | Freq: Two times a day (BID) | ORAL | 0 refills | Status: DC

## 2019-07-22 NOTE — Assessment & Plan Note (Signed)
On prevacid.  
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
## 2019-07-22 NOTE — Assessment & Plan Note (Signed)
stable °

## 2019-07-22 NOTE — Patient Instructions (Signed)
Social Anxiety Disorder, Adult Social anxiety disorder (SAD), previously called social phobia, is a mental health condition. People with SAD often feel nervous, afraid, or embarrassed when they are around other people in social situations. They worry that other people are judging or criticizing them for how they look, what they say, or how they act. SAD involves more than just feeling shy or self-conscious at times. It can cause severe emotional distress. It can interfere with activities of daily life. SAD also may lead to alcohol or drug use, and even suicide. SAD is a common mental health condition. It can develop at any time, but it usually starts in the teenage years. What are the causes? The cause of this condition is not known. It may involve genes that are passed through families. Stressful events may trigger anxiety. This disorder is also associated with an overactive amygdala. The amygdala is the part of the brain that triggers your response to strong feelings, such as fear. What increases the risk? This condition is more likely to develop in:  People who have a family history of anxiety disorders.  Women.  People who have a physical or behavioral condition that makes them feel self-conscious or nervous, such as a stutter or a long-term (chronic) disease. What are the signs or symptoms? The main symptom of this condition is fear of embarrassment caused by being criticized or judged in social situations. You may be afraid to:  Speak in public.  Go shopping.  Use a public bathroom.  Eat at a restaurant.  Go to work.  Interact with people you do not know. Extreme fear and anxiety may cause physical symptoms, including:  Blushing.  A fast heartbeat.  Sweating.  Shaky hands or voice.  Confusion.  Light-headedness.  Upset stomach, diarrhea, or vomiting.  Shortness of breath. How is this diagnosed? This condition is diagnosed based on your history, symptoms, and  behavior in social situations. You may be diagnosed with this type of anxiety if your symptoms have lasted for more than 6 months and have been present on more days than not. Your health care provider may ask you about your use of alcohol, drugs, and prescription medicines. He or she may also refer you to a mental health specialist for further evaluation or treatment. How is this treated? Treatment for this condition may include:  Cognitive behavioral therapy (CBT). This type of talk therapy helps you learn to replace negative thoughts and behaviors with positive ones. This may include learning how to use self-calming skills and other methods of managing your anxiety.  Exposure therapy. You will be exposed to social situations that cause you fear. The treatment starts with practicing self-calming in situations that cause you low levels of fear. Over time, you will progress by sustaining self-calming and managing harder situations.  Antidepressant medicines. These medicines may be used by themselves or in addition to other therapies.  Biofeedback. This process trains you to manage your body's response (physiological response) through breathing techniques and relaxation methods. You will work with a therapist while machines are used to monitor your physical symptoms.  Techniques for relaxation and managing anxiety. These include deep breathing, self-talk, meditation, visual imagery, muscle relaxation, music therapy, and yoga. These techniques are often used with other therapies to keep you calm in situations that cause you anxiety. These treatments are often used in combination. Follow these instructions at home: Alcohol use If you drink alcohol:  Limit how much you use to: ? 0-1 drink a day for   nonpregnant women. ? 0-2 drinks a day for men.  Be aware of how much alcohol is in your drink. In the [place] one drink equals one 12 oz bottle of beer (355 mL), one 5 oz glass of wine (148 mL), or one 1  oz glass of hard liquor (44 mL). General instructions  Take over-the-counter and prescription medicines only as told by your health care provider.  Practice techniques for relaxation and managing anxiety at times you are not challenged by social anxiety.  Return to social activities using techniques you have learned, as you feel ready to do so.  Avoid caffeine and certain over-the-counter cold medicines. These may make you feel worse. Ask your pharmacists which medicines to avoid.  Keep all follow-up visits as told by your health care provider. This is important. Where to find more information  National Alliance on Mental Illness (NAMI): https://www.nami.org  Social Anxiety Association: https://socialphobia.org  Mental Health America (MHA): https://www.mhanational.org  Anxiety and Depression Association of America (ADAA): https://adaa.org/ Contact a health care provider if:  Your symptoms do not improve or get worse.  You have signs of depression, such as: ? Persistent sadness or moodiness. ? Loss of enjoyment in activities that used to bring you joy. ? Change in weight or eating. ? Changes in sleeping habits. ? Avoiding friends or family members more than usual. ? Loss of energy for normal tasks. ? Feeling guilty or worthless.  You become more isolated than you normally are.  You find it more and more difficult to speak or interact with others.  You are using drugs.  You are drinking more alcohol than usual. Get help right away if:  You harm yourself.  You have suicidal thoughts. If you ever feel like you may hurt yourself or others, or have thoughts about taking your own life, get help right away. You can go to your nearest emergency department or call:  Your local emergency services (911 in the [place]  A suicide crisis helpline, such as the National Suicide Prevention Lifeline at 1-800-273-8255. This is open 24 hours a day. Summary  Social anxiety disorder  (SAD) may cause you to feel nervous, afraid, or embarrassed when you are around other people in social situations.  SAD is a common mental disorder. It can develop at any time, but it usually starts in the teenage years.  Treatment includes talk therapy, exposure therapy, medicines, biofeedback, and relaxation techniques. It can involve a combination of treatments. This information is not intended to replace advice given to you by your health care provider. Make sure you discuss any questions you have with your health care provider. Document Revised: 08/01/2018 Document Reviewed: 08/01/2018 Elsevier Patient Education  2020 Elsevier Inc.  

## 2019-07-22 NOTE — Assessment & Plan Note (Signed)
On statin.

## 2019-08-22 ENCOUNTER — Encounter: Payer: Self-pay | Admitting: Internal Medicine

## 2019-08-22 ENCOUNTER — Ambulatory Visit (INDEPENDENT_AMBULATORY_CARE_PROVIDER_SITE_OTHER): Payer: BC Managed Care – PPO | Admitting: Internal Medicine

## 2019-08-22 ENCOUNTER — Other Ambulatory Visit: Payer: Self-pay

## 2019-08-22 VITALS — BP 120/81 | HR 83 | Ht 64.0 in | Wt 183.1 lb

## 2019-08-22 DIAGNOSIS — E782 Mixed hyperlipidemia: Secondary | ICD-10-CM | POA: Diagnosis not present

## 2019-08-22 DIAGNOSIS — L0231 Cutaneous abscess of buttock: Secondary | ICD-10-CM | POA: Insufficient documentation

## 2019-08-22 DIAGNOSIS — F419 Anxiety disorder, unspecified: Secondary | ICD-10-CM | POA: Diagnosis not present

## 2019-08-22 DIAGNOSIS — L03317 Cellulitis of buttock: Secondary | ICD-10-CM

## 2019-08-22 DIAGNOSIS — E785 Hyperlipidemia, unspecified: Secondary | ICD-10-CM | POA: Diagnosis not present

## 2019-08-22 MED ORDER — LOVASTATIN 20 MG PO TABS: 20.0000 mg | ORAL_TABLET | Freq: Every day | ORAL | 6 refills | Status: DC

## 2019-08-22 MED ORDER — LOVASTATIN 20 MG PO TABS: 20.0000 mg | ORAL_TABLET | Freq: Every day | ORAL | 2 refills | Status: DC

## 2019-08-22 MED ORDER — DOXYCYCLINE HYCLATE 100 MG PO TABS: 100.0000 mg | ORAL_TABLET | Freq: Two times a day (BID) | ORAL | 0 refills | Status: DC

## 2019-08-22 MED ORDER — ALPRAZOLAM 0.5 MG PO TABS: 0.5000 mg | ORAL_TABLET | Freq: Two times a day (BID) | ORAL | 0 refills | Status: DC

## 2019-08-22 NOTE — Assessment & Plan Note (Signed)
Chronic problem patient was advised to keep his daily statin medication.
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
## 2019-08-22 NOTE — Assessment & Plan Note (Signed)
Patient was advised to see a psychiatrist.

## 2019-08-22 NOTE — Patient Instructions (Addendum)
Look into a physiatrist that offers a Sliding Scale options for payment as this might be something that you could more reasonably afford.   [facility] [address] [place] [address] [phone]  [facility] [address] [place] [address] [phone]

## 2019-09-19 ENCOUNTER — Other Ambulatory Visit: Payer: Self-pay

## 2019-09-19 ENCOUNTER — Ambulatory Visit (INDEPENDENT_AMBULATORY_CARE_PROVIDER_SITE_OTHER): Payer: BC Managed Care – PPO | Admitting: Internal Medicine

## 2019-09-19 ENCOUNTER — Encounter: Payer: Self-pay | Admitting: Internal Medicine

## 2019-09-19 VITALS — BP 136/85 | HR 78 | Ht 66.0 in | Wt 188.5 lb

## 2019-09-19 DIAGNOSIS — K219 Gastro-esophageal reflux disease without esophagitis: Secondary | ICD-10-CM

## 2019-09-19 DIAGNOSIS — F419 Anxiety disorder, unspecified: Secondary | ICD-10-CM

## 2019-09-19 DIAGNOSIS — I10 Essential (primary) hypertension: Secondary | ICD-10-CM

## 2019-09-19 DIAGNOSIS — E782 Mixed hyperlipidemia: Secondary | ICD-10-CM | POA: Diagnosis not present

## 2019-09-19 MED ORDER — ALPRAZOLAM 0.5 MG PO TABS: 0.5000 mg | ORAL_TABLET | Freq: Two times a day (BID) | ORAL | 0 refills | Status: DC

## 2019-09-19 NOTE — Assessment & Plan Note (Signed)
On lova statin/ pt has stent  2005/ need to check lipids
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
## 2019-09-19 NOTE — Assessment & Plan Note (Signed)
-   Today, the patient's blood pressure is well managed on lopressor. - The patient will continue the current treatment regimen.  - I encouraged the patient to eat a low-sodium diet to help control blood pressure. - I encouraged the patient to live an active lifestyle and complete activities that increases heart rate to 85% target heart rate at least 5 times per week for one hour.     

## 2019-09-19 NOTE — Assessment & Plan Note (Signed)
stable °

## 2019-10-14 ENCOUNTER — Other Ambulatory Visit (INDEPENDENT_AMBULATORY_CARE_PROVIDER_SITE_OTHER): Payer: BC Managed Care – PPO

## 2019-10-14 ENCOUNTER — Other Ambulatory Visit: Payer: Self-pay

## 2019-10-14 DIAGNOSIS — K219 Gastro-esophageal reflux disease without esophagitis: Secondary | ICD-10-CM | POA: Diagnosis not present

## 2019-10-14 DIAGNOSIS — E782 Mixed hyperlipidemia: Secondary | ICD-10-CM

## 2019-10-14 DIAGNOSIS — F419 Anxiety disorder, unspecified: Secondary | ICD-10-CM | POA: Diagnosis not present

## 2019-10-14 DIAGNOSIS — I10 Essential (primary) hypertension: Secondary | ICD-10-CM | POA: Diagnosis not present

## 2019-10-15 LAB — CBC WITH DIFFERENTIAL/PLATELET
Absolute Monocytes: 909 cells/uL (ref 200–950)
Basophils Absolute: 47 cells/uL (ref 0–200)
Basophils Relative: 0.4 %
Eosinophils Absolute: 319 cells/uL (ref 15–500)
Eosinophils Relative: 2.7 %
HCT: 44.6 % (ref 38.5–50.0)
Hemoglobin: 15.6 g/dL (ref 13.2–17.1)
Lymphs Abs: 3634 cells/uL (ref 850–3900)
MCH: 32.6 pg (ref 27.0–33.0)
MCHC: 35 g/dL (ref 32.0–36.0)
MCV: 93.1 fL (ref 80.0–100.0)
MPV: 11.1 fL (ref 7.5–12.5)
Monocytes Relative: 7.7 %
Neutro Abs: 6891 cells/uL (ref 1500–7800)
Neutrophils Relative %: 58.4 %
Platelets: 196 10*3/uL (ref 140–400)
RBC: 4.79 10*6/uL (ref 4.20–5.80)
RDW: 12.9 % (ref 11.0–15.0)
Total Lymphocyte: 30.8 %
WBC: 11.8 10*3/uL — ABNORMAL HIGH (ref 3.8–10.8)

## 2019-10-15 LAB — LIPID PANEL
Cholesterol: 261 mg/dL — ABNORMAL HIGH (ref <200)
HDL: 61 mg/dL (ref >=40)
LDL Cholesterol (Calc): 152 mg/dL (calc) — ABNORMAL HIGH
Non-HDL Cholesterol (Calc): 200 mg/dL (calc) — ABNORMAL HIGH (ref <130)
Total CHOL/HDL Ratio: 4.3 (calc) (ref <5.0)
Triglycerides: 290 mg/dL — ABNORMAL HIGH (ref <150)

## 2019-10-15 LAB — COMPLETE METABOLIC PANEL WITH GFR
AG Ratio: 1.6 (calc) (ref 1.0–2.5)
ALT: 13 U/L (ref 9–46)
AST: 14 U/L (ref 10–35)
Albumin: 4.3 g/dL (ref 3.6–5.1)
Alkaline phosphatase (APISO): 64 U/L (ref 35–144)
BUN: 19 mg/dL (ref 7–25)
CO2: 24 mmol/L (ref 20–32)
Calcium: 9.7 mg/dL (ref 8.6–10.3)
Chloride: 104 mmol/L (ref 98–110)
Creat: 1.09 mg/dL (ref 0.70–1.33)
GFR, Est African American: 89 mL/min/{1.73_m2} (ref >=60)
GFR, Est Non African American: 77 mL/min/{1.73_m2} (ref >=60)
Globulin: 2.7 g/dL (calc) (ref 1.9–3.7)
Glucose, Bld: 80 mg/dL (ref 65–99)
Potassium: 4.1 mmol/L (ref 3.5–5.3)
Sodium: 136 mmol/L (ref 135–146)
Total Bilirubin: 0.4 mg/dL (ref 0.2–1.2)
Total Protein: 7 g/dL (ref 6.1–8.1)

## 2019-10-15 LAB — PSA: PSA: 0.9 ng/mL (ref <=4.0)

## 2019-10-15 LAB — TSH: TSH: 0.42 mIU/L (ref 0.40–4.50)

## 2019-10-18 ENCOUNTER — Ambulatory Visit (INDEPENDENT_AMBULATORY_CARE_PROVIDER_SITE_OTHER): Payer: BC Managed Care – PPO | Admitting: Internal Medicine

## 2019-10-18 ENCOUNTER — Encounter: Payer: Self-pay | Admitting: Internal Medicine

## 2019-10-18 ENCOUNTER — Other Ambulatory Visit: Payer: Self-pay

## 2019-10-18 VITALS — BP 125/87 | HR 89 | Wt 184.2 lb

## 2019-10-18 DIAGNOSIS — F17200 Nicotine dependence, unspecified, uncomplicated: Secondary | ICD-10-CM | POA: Diagnosis not present

## 2019-10-18 DIAGNOSIS — Z720 Tobacco use: Secondary | ICD-10-CM | POA: Insufficient documentation

## 2019-10-18 DIAGNOSIS — E782 Mixed hyperlipidemia: Secondary | ICD-10-CM

## 2019-10-18 DIAGNOSIS — F419 Anxiety disorder, unspecified: Secondary | ICD-10-CM

## 2019-10-18 MED ORDER — ROSUVASTATIN CALCIUM 20 MG PO TABS: 20.0000 mg | ORAL_TABLET | Freq: Every day | ORAL | 3 refills | Status: DC

## 2019-10-18 MED ORDER — ALPRAZOLAM 0.5 MG PO TABS: 0.5000 mg | ORAL_TABLET | Freq: Two times a day (BID) | ORAL | 0 refills | Status: DC

## 2019-10-18 NOTE — Assessment & Plan Note (Signed)
-   I instructed the patient to stop smoking and provided them with smoking cessation materials.  - I informed the patient that smoking puts them at increased risk for cancer, COPD, hypertension, and more.  - Informed the patient to seek help if they begin to have trouble breathing, develop chest pain, start to cough up blood, feel faint, or pass out.  
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
## 2019-10-18 NOTE — Assessment & Plan Note (Signed)
Pt has a Hx of two blockages after a cardiac cath, which was more than 10 years ago. He is not having any angina. I suggested that he should get a stress test and an Echo completed.

## 2019-10-18 NOTE — Assessment & Plan Note (Signed)
-   Patient experiencing high levels of anxiety.  - Encouraged patient to engage in relaxing activities like yoga, meditation, journaling, going for a walk, or participating in a hobby.  - Encouraged patient to reach out to trusted friends or family members about recent struggles 

## 2019-10-18 NOTE — Patient Instructions (Signed)
[facility] [address] [place],  [place]  [address] [phone]

## 2019-11-15 ENCOUNTER — Other Ambulatory Visit: Payer: Self-pay

## 2019-11-15 ENCOUNTER — Ambulatory Visit: Payer: BC Managed Care – PPO | Admitting: Internal Medicine

## 2019-11-15 DIAGNOSIS — F419 Anxiety disorder, unspecified: Secondary | ICD-10-CM

## 2019-11-15 MED ORDER — ROSUVASTATIN CALCIUM 20 MG PO TABS: 20.0000 mg | ORAL_TABLET | Freq: Every day | ORAL | 3 refills | Status: DC

## 2019-11-15 MED ORDER — ALPRAZOLAM 0.5 MG PO TABS: 0.5000 mg | ORAL_TABLET | Freq: Two times a day (BID) | ORAL | 0 refills | Status: DC

## 2019-12-17 ENCOUNTER — Ambulatory Visit (INDEPENDENT_AMBULATORY_CARE_PROVIDER_SITE_OTHER): Payer: BC Managed Care – PPO | Admitting: Internal Medicine

## 2019-12-17 ENCOUNTER — Encounter: Payer: Self-pay | Admitting: Internal Medicine

## 2019-12-17 ENCOUNTER — Other Ambulatory Visit: Payer: Self-pay

## 2019-12-17 VITALS — BP 149/98 | HR 82 | Ht 66.0 in | Wt 176.4 lb

## 2019-12-17 DIAGNOSIS — I10 Essential (primary) hypertension: Secondary | ICD-10-CM | POA: Diagnosis not present

## 2019-12-17 DIAGNOSIS — E782 Mixed hyperlipidemia: Secondary | ICD-10-CM

## 2019-12-17 DIAGNOSIS — F419 Anxiety disorder, unspecified: Secondary | ICD-10-CM | POA: Diagnosis not present

## 2019-12-17 DIAGNOSIS — F17200 Nicotine dependence, unspecified, uncomplicated: Secondary | ICD-10-CM

## 2019-12-17 MED ORDER — ALPRAZOLAM 0.5 MG PO TABS: 0.5000 mg | ORAL_TABLET | Freq: Two times a day (BID) | ORAL | 0 refills | Status: DC

## 2019-12-17 NOTE — Assessment & Plan Note (Signed)
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
## 2019-12-17 NOTE — Assessment & Plan Note (Signed)
-   Today, the patient's blood pressure is well managed on present  regimen. - The patient will continue the current treatment regimen.  - I encouraged the patient to eat a low-sodium diet to help control blood pressure. - I encouraged the patient to live an active lifestyle and complete activities that increases heart rate to 85% target heart rate at least 5 times per week for one hour.    

## 2019-12-17 NOTE — Assessment & Plan Note (Signed)
-   Patient experiencing high levels of anxiety.  - Encouraged patient to engage in relaxing activities like yoga, meditation, journaling, going for a walk, or participating in a hobby.  - Encouraged patient to reach out to trusted friends or family members about recent struggles 

## 2019-12-17 NOTE — Assessment & Plan Note (Signed)
-   I instructed the patient to stop smoking and provided them with smoking cessation materials.  - I informed the patient that smoking puts them at increased risk for cancer, COPD, hypertension, and more.  - Informed the patient to seek help if they begin to have trouble breathing, develop chest pain, start to cough up blood, feel faint, or pass out.  

## 2020-01-14 ENCOUNTER — Other Ambulatory Visit: Payer: Self-pay

## 2020-01-14 DIAGNOSIS — F419 Anxiety disorder, unspecified: Secondary | ICD-10-CM

## 2020-01-14 MED ORDER — ALPRAZOLAM 0.5 MG PO TABS: 0.5000 mg | ORAL_TABLET | Freq: Two times a day (BID) | ORAL | 0 refills | Status: DC

## 2020-01-25 ENCOUNTER — Observation Stay
Admission: EM | Admit: 2020-01-25 | Discharge: 2020-01-26 | Disposition: A | Discharge status: Home / Self Care | Payer: BC Managed Care – PPO | Attending: Internal Medicine | Admitting: Internal Medicine

## 2020-01-25 ENCOUNTER — Other Ambulatory Visit: Payer: Self-pay

## 2020-01-25 DIAGNOSIS — F10929 Alcohol use, unspecified with intoxication, unspecified: Secondary | ICD-10-CM | POA: Diagnosis not present

## 2020-01-25 DIAGNOSIS — F17200 Nicotine dependence, unspecified, uncomplicated: Secondary | ICD-10-CM | POA: Diagnosis present

## 2020-01-25 DIAGNOSIS — Z20822 Contact with and (suspected) exposure to covid-19: Secondary | ICD-10-CM | POA: Insufficient documentation

## 2020-01-25 DIAGNOSIS — F17210 Nicotine dependence, cigarettes, uncomplicated: Secondary | ICD-10-CM | POA: Insufficient documentation

## 2020-01-25 DIAGNOSIS — F10920 Alcohol use, unspecified with intoxication, uncomplicated: Secondary | ICD-10-CM

## 2020-01-25 DIAGNOSIS — Z720 Tobacco use: Secondary | ICD-10-CM

## 2020-01-25 DIAGNOSIS — Z79899 Other long term (current) drug therapy: Secondary | ICD-10-CM | POA: Insufficient documentation

## 2020-01-25 DIAGNOSIS — T424X1A Poisoning by benzodiazepines, accidental (unintentional), initial encounter: Secondary | ICD-10-CM | POA: Insufficient documentation

## 2020-01-25 DIAGNOSIS — R55 Syncope and collapse: Principal | ICD-10-CM | POA: Insufficient documentation

## 2020-01-25 DIAGNOSIS — R079 Chest pain, unspecified: Secondary | ICD-10-CM | POA: Diagnosis not present

## 2020-01-25 DIAGNOSIS — E785 Hyperlipidemia, unspecified: Secondary | ICD-10-CM

## 2020-01-25 DIAGNOSIS — I2510 Atherosclerotic heart disease of native coronary artery without angina pectoris: Secondary | ICD-10-CM | POA: Diagnosis not present

## 2020-01-25 DIAGNOSIS — I10 Essential (primary) hypertension: Secondary | ICD-10-CM | POA: Diagnosis present

## 2020-01-25 DIAGNOSIS — I119 Hypertensive heart disease without heart failure: Secondary | ICD-10-CM | POA: Diagnosis not present

## 2020-01-25 DIAGNOSIS — E876 Hypokalemia: Secondary | ICD-10-CM

## 2020-01-25 DIAGNOSIS — K219 Gastro-esophageal reflux disease without esophagitis: Secondary | ICD-10-CM

## 2020-01-25 DIAGNOSIS — F10220 Alcohol dependence with intoxication, uncomplicated: Secondary | ICD-10-CM | POA: Diagnosis not present

## 2020-01-25 LAB — URINALYSIS, COMPLETE (UACMP) WITH MICROSCOPIC
Bacteria, UA: NONE SEEN
Bilirubin Urine: NEGATIVE
Glucose, UA: NEGATIVE mg/dL
Hgb urine dipstick: NEGATIVE
Ketones, ur: NEGATIVE mg/dL
Leukocytes,Ua: NEGATIVE
Nitrite: NEGATIVE
Protein, ur: NEGATIVE mg/dL
Specific Gravity, Urine: 1.004 — ABNORMAL LOW (ref 1.005–1.030)
Squamous Epithelial / HPF: NONE SEEN (ref 0–5)
pH: 5 (ref 5.0–8.0)

## 2020-01-25 LAB — COMPREHENSIVE METABOLIC PANEL
ALT: 20 U/L (ref 0–44)
AST: 22 U/L (ref 15–41)
Albumin: 3.6 g/dL (ref 3.5–5.0)
Alkaline Phosphatase: 79 U/L (ref 38–126)
Anion gap: 12 (ref 5–15)
BUN: 10 mg/dL (ref 6–20)
CO2: 20 mmol/L — ABNORMAL LOW (ref 22–32)
Calcium: 8.2 mg/dL — ABNORMAL LOW (ref 8.9–10.3)
Chloride: 102 mmol/L (ref 98–111)
Creatinine, Ser: 0.96 mg/dL (ref 0.61–1.24)
GFR, Estimated: >60 mL/min (ref >60)
Glucose, Bld: 109 mg/dL — ABNORMAL HIGH (ref 70–99)
Potassium: 3.1 mmol/L — ABNORMAL LOW (ref 3.5–5.1)
Sodium: 134 mmol/L — ABNORMAL LOW (ref 135–145)
Total Bilirubin: 0.7 mg/dL (ref 0.3–1.2)
Total Protein: 6.3 g/dL — ABNORMAL LOW (ref 6.5–8.1)

## 2020-01-25 LAB — URINE DRUG SCREEN, QUALITATIVE (ARMC ONLY)
Amphetamines, Ur Screen: NOT DETECTED
Barbiturates, Ur Screen: NOT DETECTED
Benzodiazepine, Ur Scrn: POSITIVE — AB
Cannabinoid 50 Ng, Ur ~~LOC~~: NOT DETECTED
Cocaine Metabolite,Ur ~~LOC~~: NOT DETECTED
MDMA (Ecstasy)Ur Screen: NOT DETECTED
Methadone Scn, Ur: NOT DETECTED
Opiate, Ur Screen: NOT DETECTED
Phencyclidine (PCP) Ur S: NOT DETECTED
Tricyclic, Ur Screen: NOT DETECTED

## 2020-01-25 LAB — CBC WITH DIFFERENTIAL/PLATELET
Abs Immature Granulocytes: 0.08 10*3/uL — ABNORMAL HIGH (ref 0.00–0.07)
Basophils Absolute: 0 10*3/uL (ref 0.0–0.1)
Basophils Relative: 0 %
Eosinophils Absolute: 0.2 10*3/uL (ref 0.0–0.5)
Eosinophils Relative: 2 %
HCT: 40.4 % (ref 39.0–52.0)
Hemoglobin: 13.8 g/dL (ref 13.0–17.0)
Immature Granulocytes: 1 %
Lymphocytes Relative: 14 %
Lymphs Abs: 1.3 10*3/uL (ref 0.7–4.0)
MCH: 32.6 pg (ref 26.0–34.0)
MCHC: 34.2 g/dL (ref 30.0–36.0)
MCV: 95.5 fL (ref 80.0–100.0)
Monocytes Absolute: 0.6 10*3/uL (ref 0.1–1.0)
Monocytes Relative: 7 %
Neutro Abs: 6.8 10*3/uL (ref 1.7–7.7)
Neutrophils Relative %: 76 %
Platelets: 143 10*3/uL — ABNORMAL LOW (ref 150–400)
RBC: 4.23 MIL/uL (ref 4.22–5.81)
RDW: 13.4 % (ref 11.5–15.5)
WBC: 9 10*3/uL (ref 4.0–10.5)
nRBC: 0 % (ref 0.0–0.2)

## 2020-01-25 LAB — RESPIRATORY PANEL BY RT PCR (FLU A&B, COVID)
Influenza A by PCR: NEGATIVE
Influenza B by PCR: NEGATIVE
SARS Coronavirus 2 by RT PCR: NEGATIVE

## 2020-01-25 LAB — LIPASE, BLOOD: Lipase: 24 U/L (ref 11–51)

## 2020-01-25 LAB — SALICYLATE LEVEL: Salicylate Lvl: <7 mg/dL — ABNORMAL LOW (ref 7.0–30.0)

## 2020-01-25 LAB — TROPONIN I (HIGH SENSITIVITY): Troponin I (High Sensitivity): 101 ng/L (ref <18)

## 2020-01-25 LAB — ETHANOL: Alcohol, Ethyl (B): 117 mg/dL — ABNORMAL HIGH (ref <10)

## 2020-01-25 LAB — MAGNESIUM: Magnesium: 1.8 mg/dL (ref 1.7–2.4)

## 2020-01-25 MED ORDER — ENOXAPARIN SODIUM 40 MG/0.4ML ~~LOC~~ SOLN
40.0000 mg | SUBCUTANEOUS | Status: DC
  Administered 2020-01-25: 40 mg via SUBCUTANEOUS
  Filled 2020-01-25 (×2): qty 0.4

## 2020-01-25 MED ORDER — ASPIRIN EC 81 MG PO TBEC
81.0000 mg | DELAYED_RELEASE_TABLET | Freq: Every day | ORAL | Status: DC
  Administered 2020-01-25 – 2020-01-26 (×2): 81 mg via ORAL
  Filled 2020-01-25 (×2): qty 1

## 2020-01-25 MED ORDER — THIAMINE HCL 100 MG PO TABS
100.0000 mg | ORAL_TABLET | Freq: Every day | ORAL | Status: DC
  Administered 2020-01-26: 100 mg via ORAL
  Filled 2020-01-25: qty 1

## 2020-01-25 MED ORDER — ALPRAZOLAM 0.5 MG PO TABS
0.5000 mg | ORAL_TABLET | Freq: Two times a day (BID) | ORAL | Status: DC
  Administered 2020-01-25: 0.5 mg via ORAL
  Filled 2020-01-25: qty 1

## 2020-01-25 MED ORDER — METOPROLOL SUCCINATE ER 25 MG PO TB24
25.0000 mg | ORAL_TABLET | Freq: Every day | ORAL | Status: DC
  Administered 2020-01-26: 25 mg via ORAL
  Filled 2020-01-25 (×2): qty 1

## 2020-01-25 MED ORDER — ROSUVASTATIN CALCIUM 10 MG PO TABS
20.0000 mg | ORAL_TABLET | Freq: Every day | ORAL | Status: DC
  Administered 2020-01-25 – 2020-01-26 (×2): 20 mg via ORAL
  Filled 2020-01-25: qty 1
  Filled 2020-01-25: qty 2

## 2020-01-25 MED ORDER — NICOTINE 21 MG/24HR TD PT24
21.0000 mg | MEDICATED_PATCH | Freq: Every day | TRANSDERMAL | Status: DC
  Administered 2020-01-25 – 2020-01-26 (×2): 21 mg via TRANSDERMAL
  Filled 2020-01-25 (×2): qty 1

## 2020-01-25 MED ORDER — PANTOPRAZOLE SODIUM 40 MG IV SOLR
40.0000 mg | INTRAVENOUS | Status: DC
  Administered 2020-01-25: 40 mg via INTRAVENOUS
  Filled 2020-01-25: qty 40

## 2020-01-25 MED ORDER — IPRATROPIUM-ALBUTEROL 0.5-2.5 (3) MG/3ML IN SOLN: 3.0000 mL | Freq: Four times a day (QID) | RESPIRATORY_TRACT | Status: DC | PRN

## 2020-01-25 MED ORDER — THIAMINE HCL 100 MG/ML IJ SOLN
100.0000 mg | Freq: Once | INTRAMUSCULAR | Status: DC
  Administered 2020-01-25: 100 mg via INTRAVENOUS
  Filled 2020-01-25: qty 2

## 2020-01-25 MED ORDER — ALPRAZOLAM 0.5 MG PO TABS: 0.5000 mg | ORAL_TABLET | Freq: Two times a day (BID) | ORAL | Status: DC
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
## 2020-01-25 NOTE — ED Notes (Signed)
Pt states "can I get something for my anxiety, if not I'm gonna leave." MD notified.

## 2020-01-25 NOTE — ED Notes (Signed)
2C given update as to pt's transportation status. Pt to be transported by NT d/t pt becoming restless about staying in the ED.

## 2020-01-25 NOTE — ED Notes (Signed)
Pt given blanket.

## 2020-01-25 NOTE — Assessment & Plan Note (Signed)
counselled pt on tobacco cessation. Nicotine patch

## 2020-01-25 NOTE — ED Notes (Addendum)
Pt pacing in front of bed and in hallway. Pt verbally frustrated as to staying in the ED and "not going to a bed and getting the help he needs". Pt easily redirectable after this RN explain plan of care. Pt pleasant and cooperative with Dr Allena Katz.   Per Dr Allena Katz, pt is to receive xanax prior to 2200 administration time. D/t pt feeling uneasy about being in hallway bed. Pt very cooperative at this time and appreciative of care.

## 2020-01-25 NOTE — Assessment & Plan Note (Signed)
Withdrawal precaution. IV ppi. CIWA assessments.

## 2020-01-25 NOTE — Progress Notes (Signed)
Patient transferred from ED via stretcher to room 242. Oriented patient to room and room equipment. Educated and explained Fall precautions and protocol. Currently no pain or discomfort except that patient request something to help him sleep. Will notify on-call provider of patient's request.

## 2020-01-25 NOTE — Assessment & Plan Note (Addendum)
Pt has mod to high risk of coronary ischemia and needs eval of his chest pain. Cardiology has been consulted and we will cont his asa and statin.  EKG shows sinus rhythm 79 with LVH.  Cycle troponin's and echo and stress test per cardiology. AM lipid panel thyroid function test.
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
## 2020-01-25 NOTE — Discharge Instructions (Addendum)
Syncope  Syncope refers to a condition in which a person temporarily loses consciousness. Syncope may also be called fainting or passing out. It is caused by a sudden decrease in blood flow to the brain. Even though most causes of syncope are not dangerous, syncope can be a sign of a serious medical problem. Your health care provider may do tests to find the reason why you are having syncope. Signs that you may be about to faint include:  Feeling dizzy or light-headed.  Feeling nauseous.  Seeing all white or all black in your field of vision.  Having cold, clammy skin. If you faint, get medical help right away. Call your local emergency services (911 in the [place] Do not drive yourself to the hospital. Follow these instructions at home: Pay attention to any changes in your symptoms. Take these actions to stay safe and to help relieve your symptoms: Lifestyle  Do not drive, use machinery, or play sports until your health care provider says it is okay.  Do not drink alcohol.  Do not use any products that contain nicotine or tobacco, such as cigarettes and e-cigarettes. If you need help quitting, ask your health care provider.  Drink enough fluid to keep your urine pale yellow. General instructions  Take over-the-counter and prescription medicines only as told by your health care provider.  If you are taking blood pressure or heart medicine, get up slowly and take several minutes to sit and then stand. This can reduce dizziness or light-headedness.  Have someone stay with you until you feel stable.  If you start to feel like you might faint, lie down right away and raise (elevate) your feet above the level of your heart. Breathe deeply and steadily. Wait until all the symptoms have passed.  Keep all follow-up visits as told by your health care provider. This is important. Get help right away if you:  Have a severe headache.  Faint once or repeatedly.  Have pain in your chest,  abdomen, or back.  Have a very fast or irregular heartbeat (palpitations).  Have pain when you breathe.  Are bleeding from your mouth or rectum, or you have black or tarry stool.  Have a seizure.  Are confused.  Have trouble walking.  Have severe weakness.  Have vision problems. These symptoms may represent a serious problem that is an emergency. Do not wait to see if your symptoms will go away. Get medical help right away. Call your local emergency services (911 in the [place] Do not drive yourself to the hospital. Summary  Syncope refers to a condition in which a person temporarily loses consciousness. It is caused by a sudden decrease in blood flow to the brain.  Signs that you may be about to faint include dizziness, feeling light-headed, feeling nauseous, sudden vision changes, or cold, clammy skin.  Although most causes of syncope are not dangerous, syncope can be a sign of a serious medical problem. If you faint, get medical help right away. This information is not intended to replace advice given to you by your health care provider. Make sure you discuss any questions you have with your health care provider. Document Revised: 02/10/2017 Document Reviewed: 02/06/2017 Elsevier Patient Education  2020 [employer] Inc.  
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
## 2020-01-25 NOTE — Assessment & Plan Note (Signed)
Blood pressure 112/88, pulse 82, temperature 98.8 F (37.1 C), temperature source Oral, resp. rate 16, height 5\' 6"  (1.676 m), weight 77.1 kg, SpO2 98 %. BP is well controlled and PTA meds are metoprolol 25 mg and Zestoretic 20/12.5 mg We will cont metoprolol with parameters.

## 2020-01-25 NOTE — ED Notes (Signed)
Transportation requested  

## 2020-01-25 NOTE — ED Notes (Signed)
Pt provided with snack tray. No other needs at this time.

## 2020-01-25 NOTE — Assessment & Plan Note (Signed)
IV ppi therapy.  

## 2020-01-26 ENCOUNTER — Inpatient Hospital Stay: Admit: 2020-01-26 | Payer: BC Managed Care – PPO

## 2020-01-26 DIAGNOSIS — R7989 Other specified abnormal findings of blood chemistry: Secondary | ICD-10-CM | POA: Diagnosis not present

## 2020-01-26 DIAGNOSIS — E876 Hypokalemia: Secondary | ICD-10-CM

## 2020-01-26 DIAGNOSIS — F419 Anxiety disorder, unspecified: Secondary | ICD-10-CM

## 2020-01-26 DIAGNOSIS — R55 Syncope and collapse: Principal | ICD-10-CM

## 2020-01-26 DIAGNOSIS — I10 Essential (primary) hypertension: Secondary | ICD-10-CM

## 2020-01-26 DIAGNOSIS — E785 Hyperlipidemia, unspecified: Secondary | ICD-10-CM | POA: Diagnosis not present

## 2020-01-26 DIAGNOSIS — Z720 Tobacco use: Secondary | ICD-10-CM

## 2020-01-26 LAB — CBC WITH DIFFERENTIAL/PLATELET
Abs Immature Granulocytes: 0.04 10*3/uL (ref 0.00–0.07)
Basophils Absolute: 0 10*3/uL (ref 0.0–0.1)
Basophils Relative: 0 %
Eosinophils Absolute: 0.4 10*3/uL (ref 0.0–0.5)
Eosinophils Relative: 5 %
HCT: 41.9 % (ref 39.0–52.0)
Hemoglobin: 14.5 g/dL (ref 13.0–17.0)
Immature Granulocytes: 1 %
Lymphocytes Relative: 32 %
Lymphs Abs: 2.6 10*3/uL (ref 0.7–4.0)
MCH: 32.6 pg (ref 26.0–34.0)
MCHC: 34.6 g/dL (ref 30.0–36.0)
MCV: 94.2 fL (ref 80.0–100.0)
Monocytes Absolute: 0.8 10*3/uL (ref 0.1–1.0)
Monocytes Relative: 9 %
Neutro Abs: 4.4 10*3/uL (ref 1.7–7.7)
Neutrophils Relative %: 53 %
Platelets: 159 10*3/uL (ref 150–400)
RBC: 4.45 MIL/uL (ref 4.22–5.81)
RDW: 13.3 % (ref 11.5–15.5)
WBC: 8.3 10*3/uL (ref 4.0–10.5)
nRBC: 0 % (ref 0.0–0.2)

## 2020-01-26 LAB — CK: Total CK: 75 U/L (ref 49–397)

## 2020-01-26 LAB — COMPREHENSIVE METABOLIC PANEL
ALT: 18 U/L (ref 0–44)
AST: 21 U/L (ref 15–41)
Albumin: 3.6 g/dL (ref 3.5–5.0)
Alkaline Phosphatase: 79 U/L (ref 38–126)
Anion gap: 7 (ref 5–15)
BUN: 15 mg/dL (ref 6–20)
CO2: 26 mmol/L (ref 22–32)
Calcium: 8.7 mg/dL — ABNORMAL LOW (ref 8.9–10.3)
Chloride: 102 mmol/L (ref 98–111)
Creatinine, Ser: 0.98 mg/dL (ref 0.61–1.24)
GFR, Estimated: >60 mL/min (ref >60)
Glucose, Bld: 124 mg/dL — ABNORMAL HIGH (ref 70–99)
Potassium: 3.2 mmol/L — ABNORMAL LOW (ref 3.5–5.1)
Sodium: 135 mmol/L (ref 135–145)
Total Bilirubin: 0.7 mg/dL (ref 0.3–1.2)
Total Protein: 6.4 g/dL — ABNORMAL LOW (ref 6.5–8.1)

## 2020-01-26 LAB — MAGNESIUM: Magnesium: 1.8 mg/dL (ref 1.7–2.4)

## 2020-01-26 LAB — TROPONIN I (HIGH SENSITIVITY)
Troponin I (High Sensitivity): 134 ng/L (ref <18)
Troponin I (High Sensitivity): 122 ng/L (ref <18)

## 2020-01-26 LAB — LIPID PANEL
Cholesterol: 192 mg/dL (ref 0–200)
HDL: 58 mg/dL (ref >40)
LDL Cholesterol: 87 mg/dL (ref 0–99)
Total CHOL/HDL Ratio: 3.3 RATIO
Triglycerides: 233 mg/dL — ABNORMAL HIGH (ref <150)
VLDL: 47 mg/dL — ABNORMAL HIGH (ref 0–40)

## 2020-01-26 LAB — TSH: TSH: 0.185 u[IU]/mL — ABNORMAL LOW (ref 0.350–4.500)

## 2020-01-26 LAB — HIV ANTIBODY (ROUTINE TESTING W REFLEX): HIV Screen 4th Generation wRfx: NONREACTIVE

## 2020-01-26 LAB — LIPASE, BLOOD: Lipase: 31 U/L (ref 11–51)

## 2020-01-26 LAB — HEMOGLOBIN A1C
Hgb A1c MFr Bld: 4.9 % (ref 4.8–5.6)
Mean Plasma Glucose: 93.93 mg/dL

## 2020-01-26 LAB — PHOSPHORUS: Phosphorus: 3 mg/dL (ref 2.5–4.6)

## 2020-01-26 LAB — T4, FREE: Free T4: 0.67 ng/dL (ref 0.61–1.12)

## 2020-01-26 MED ORDER — ALPRAZOLAM 0.25 MG PO TABS
0.2500 mg | ORAL_TABLET | Freq: Two times a day (BID) | ORAL | Status: DC | PRN
  Administered 2020-01-26: 0.25 mg via ORAL
  Filled 2020-01-26: qty 1

## 2020-01-26 MED ORDER — METOPROLOL TARTRATE 25 MG PO TABS
12.5000 mg | ORAL_TABLET | Freq: Two times a day (BID) | ORAL | 0 refills | Status: DC
Start: 2020-01-26 — End: 2020-01-26

## 2020-01-26 MED ORDER — ASPIRIN 81 MG PO TBEC: 81.0000 mg | DELAYED_RELEASE_TABLET | Freq: Every day | ORAL | 0 refills | Status: AC

## 2020-01-26 MED ORDER — POTASSIUM CHLORIDE CRYS ER 20 MEQ PO TBCR
40.0000 meq | EXTENDED_RELEASE_TABLET | Freq: Once | ORAL | Status: AC
  Administered 2020-01-26: 40 meq via ORAL
  Filled 2020-01-26: qty 2

## 2020-01-26 MED ORDER — THIAMINE HCL 100 MG PO TABS
100.0000 mg | ORAL_TABLET | Freq: Every day | ORAL | 0 refills | Status: DC
Start: 2020-01-27 — End: 2020-01-26

## 2020-01-26 MED ORDER — PANTOPRAZOLE SODIUM 40 MG PO TBEC
40.0000 mg | DELAYED_RELEASE_TABLET | Freq: Every day | ORAL | Status: DC
  Administered 2020-01-26: 40 mg via ORAL
  Filled 2020-01-26: qty 1

## 2020-01-26 MED ORDER — HYDROXYZINE HCL 25 MG PO TABS
25.0000 mg | ORAL_TABLET | Freq: Four times a day (QID) | ORAL | Status: DC | PRN
  Administered 2020-01-26 (×2): 25 mg via ORAL
  Filled 2020-01-26 (×2): qty 1

## 2020-01-26 MED ORDER — NICOTINE 21 MG/24HR TD PT24
MEDICATED_PATCH | TRANSDERMAL | 0 refills | Status: DC
Start: 2020-01-26 — End: 2020-01-26

## 2020-01-26 MED ORDER — ASPIRIN 81 MG PO TBEC
81.0000 mg | DELAYED_RELEASE_TABLET | Freq: Every day | ORAL | 0 refills | Status: DC
Start: 2020-01-27 — End: 2020-01-26

## 2020-01-26 MED ORDER — NICOTINE 21 MG/24HR TD PT24
MEDICATED_PATCH | TRANSDERMAL | 0 refills | Status: DC
Start: 2020-01-26 — End: 2020-03-04

## 2020-01-26 MED ORDER — THIAMINE HCL 100 MG PO TABS: 100.0000 mg | ORAL_TABLET | Freq: Every day | ORAL | 0 refills | Status: AC

## 2020-01-26 MED ORDER — METOPROLOL TARTRATE 25 MG PO TABS
12.5000 mg | ORAL_TABLET | Freq: Two times a day (BID) | ORAL | 0 refills | Status: DC
Start: 2020-01-26 — End: 2020-04-29

## 2020-01-26 NOTE — TOC Transition Note (Signed)
Transition of Care [facility]) - CM/SW Discharge Note   Patient Details  Name: HEKTOR HUSTON MRN: [id number] Date of Birth: 06-19-1965  Transition of Care [facility]) CM/SW Contact:  Bing Quarry, RN Phone Number: 01/26/2020, 2:22 PM   Clinical Narrative:   TOC asked to check with patient regarding medication. Pt was ready to discharge but stated he was just going to have to save up as "my insurance is good and I can get most meds a [employer] for $10.00". Discussed compliance and financial issues that crop up. Good RX card given and explained how it works with the smartphone app. Pt. Verbalized understanding of need to continue medications as prescribed given what happened to him this admission. Dressed and ready to go so making this note on discharge. Gabriel Cirri RN CM>          Patient Goals and CMS Choice        Discharge Placement                       Discharge Plan and Services                                     Social Determinants of Health (SDOH) Interventions     Readmission Risk Interventions No flowsheet data found.

## 2020-01-26 NOTE — Progress Notes (Signed)
During the night, patient requested for blood pressure meds - Lisinopril/HCTZ and Lopressor and explains he normally gets both twice a day. Notified on-call provider. Vitals at the time of notifying on-call provider were 113/81 with HR-70. On-call provider explained to Nurse that those meds would may lower blood pressure and to start meds at next scheduled dose in the morning. Reported to oncoming Nurse.
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
## 2020-01-26 NOTE — Discharge Summary (Signed)
[facility] [place] at [facility]   PATIENT NAME: Jack [name]    MR#:  [id number]  DATE OF BIRTH:  1965/12/18  DATE OF ADMISSION:  01/25/2020 ADMITTING PHYSICIAN: Gertha Calkin, MD  DATE OF DISCHARGE: 01/27/2020  PRIMARY CARE PHYSICIAN: Corky Downs, MD    ADMISSION DIAGNOSIS:  Syncope and collapse [R55] Chest pain [R07.9] Alcoholic intoxication without complication (HCC) [F10.920] Benzodiazepine overdose, accidental or unintentional, initial encounter [T42.4X1A]  DISCHARGE DIAGNOSIS:  Principal Problem:   Chest pain Active Problems:   Alcohol intoxication (HCC)   Essential hypertension   GERD (gastroesophageal reflux disease)   Smoking   SECONDARY DIAGNOSIS:   Past Medical History:  Diagnosis Date  . Anginal pain (HCC)   . Coronary artery disease   . Heart murmur   . Hypertension   . Myocardial infarction [facility])     HOSPITAL COURSE:   1. Syncope. The patient stated that he has been out of his blood pressure medications for a month and retook them yesterday. He did a lot of work outside with regards to the lawn and then washing the car. He also had a mixed drink. He sat down into the car and collapsed. In speaking with the patient and patient's wife there was no loss of urine or bowel function, no tongue bite. No shaking episode. Patient was seen by cardiology and recommended follow-up as outpatient. Since his troponins were flat this was suggestive of dyspnea and ischemia. Telemetry did show first-degree AV block and had a few PVCs but otherwise no arrhythmias. Case also discussed with neurology and not likely seizure and more likely vasovagal syncope. Patient has felt well during the hospitalization and will be discharged home with close follow-up with his primary care physician Dr. Juel Burrow and cardiology Dr. Juliann Pares. 2. Hypokalemia this was replaced. Likely brought on by taking hydrochlorothiazide. Continue to monitor as outpatient. 3. Essential  hypertension can go back on metoprolol 12.5 mg twice a day at home. 4. Hyperlipidemia unspecified can go back on Crestor as outpatient 5. Anxiety on Xanax at home. 6. Alcohol use. Patient states that he does not use alcohol over time. Will prescribe thiamine 100 mg daily 7. Tobacco abuse. Nicotine patch prescribed 8. TSH low but T4 in the normal range. Recommend checking thyroid function as outpatient. No pain on palpating the thyroid.  DISCHARGE CONDITIONS:   Satisfactory  CONSULTS OBTAINED:  Cardiology  DRUG ALLERGIES:  No Known Allergies  DISCHARGE MEDICATIONS:   Allergies as of 01/26/2020   No Known Allergies     Medication List    STOP taking these medications   lisinopril-hydrochlorothiazide 20-12.5 MG tablet Commonly known as: ZESTORETIC     TAKE these medications   ALPRAZolam 0.5 MG tablet Commonly known as: XANAX Take 1 tablet (0.5 mg total) by mouth 2 (two) times daily.   aspirin 81 MG EC tablet Take 1 tablet (81 mg total) by mouth daily. Swallow whole. Start taking on: January 27, 2020   metoprolol tartrate 25 MG tablet Commonly known as: LOPRESSOR Take 0.5 tablets (12.5 mg total) by mouth 2 (two) times daily. What changed: how much to take   nicotine 21 mg/24hr patch Commonly known as: NICODERM CQ - dosed in mg/24 hours One patch 21mg  chest wall daily (okay to substitute generic)   rosuvastatin 20 MG tablet Commonly known as: Crestor Take 1 tablet (20 mg total) by mouth daily.   thiamine 100 MG tablet Take 1 tablet (100 mg total) by mouth daily. Start taking on:  January 27, 2020        DISCHARGE INSTRUCTIONS:   Follow-up PMD 5 days Follow-up cardiology around 1 week  If you experience worsening of your admission symptoms, develop shortness of breath, life threatening emergency, suicidal or homicidal thoughts you must seek medical attention immediately by calling 911 or calling your MD immediately  if symptoms less severe.  You Must read  complete instructions/literature along with all the possible adverse reactions/side effects for all the Medicines you take and that have been prescribed to you. Take any new Medicines after you have completely understood and accept all the possible adverse reactions/side effects.   Please note  You were cared for by a hospitalist during your hospital stay. If you have any questions about your discharge medications or the care you received while you were in the hospital after you are discharged, you can call the unit and asked to speak with the hospitalist on call if the hospitalist that took care of you is not available. Once you are discharged, your primary care physician will handle any further medical issues. Please note that NO REFILLS for any discharge medications will be authorized once you are discharged, as it is imperative that you return to your primary care physician (or establish a relationship with a primary care physician if you do not have one) for your aftercare needs so that they can reassess your need for medications and monitor your lab values.    Today   CHIEF COMPLAINT:   Chief Complaint  Patient presents with  . Alcohol Intoxication  . Loss of Consciousness    HISTORY OF PRESENT ILLNESS:  Jack [name]  is a 54 y.o. male presented with syncope   VITAL SIGNS:  Blood pressure (!) 132/92, pulse 78, temperature 98.4 F (36.9 C), temperature source Oral, resp. rate 18, height 5\' 6"  (1.676 m), weight 79.4 kg, SpO2 99 %.  I/O:    Intake/Output Summary (Last 24 hours) at 01/26/2020 1351 Last data filed at 01/26/2020 1148 Gross per 24 hour  Intake 120 ml  Output 1320 ml  Net -1200 ml    PHYSICAL EXAMINATION:  GENERAL:  54 y.o.-year-old patient lying in the bed with no acute distress.  EYES: Pupils equal, round, reactive to light and accommodation. No scleral icterus. Extraocular muscles intact.  HEENT: Head atraumatic, normocephalic. Oropharynx and nasopharynx  clear.  NECK:  No thyroid enlargement, no tenderness.  LUNGS: Normal breath sounds bilaterally, no wheezing, rales,rhonchi or crepitation. No use of accessory muscles of respiration.  CARDIOVASCULAR: S1, S2 normal. No murmurs, rubs, or gallops.  ABDOMEN: Soft, non-tender, non-distended. Bowel sounds present. No organomegaly or mass.  EXTREMITIES: No pedal edema, cyanosis, or clubbing.  NEUROLOGIC: Cranial nerves II through XII are intact. Muscle strength 5/5 in all extremities. Sensation intact. Gait not checked.  PSYCHIATRIC: The patient is alert and oriented x 3.  SKIN: No obvious rash, lesion, or ulcer.   DATA REVIEW:   CBC Recent Labs  Lab 01/26/20 0247  WBC 8.3  HGB 14.5  HCT 41.9  PLT 159    Chemistries  Recent Labs  Lab 01/26/20 0247  NA 135  K 3.2*  CL 102  CO2 26  GLUCOSE 124*  BUN 15  CREATININE 0.98  CALCIUM 8.7*  MG 1.8  AST 21  ALT 18  ALKPHOS 79  BILITOT 0.7    Cardiac Enzymes High-sensitivity troponin 101, 134 and 122  Microbiology Results  Results for orders placed or performed during the hospital encounter of 01/25/20  Respiratory Panel by RT PCR (Flu A&B, Covid) - Nasopharyngeal Swab     Status: None   Collection Time: 01/25/20  3:36 PM   Specimen: Nasopharyngeal Swab  Result Value Ref Range Status   SARS Coronavirus 2 by RT PCR NEGATIVE NEGATIVE Final    Comment: (NOTE) SARS-CoV-2 target nucleic acids are NOT DETECTED.  The SARS-CoV-2 RNA is generally detectable in upper respiratoy specimens during the acute phase of infection. The lowest concentration of SARS-CoV-2 viral copies this assay can detect is 131 copies/mL. A negative result does not preclude SARS-Cov-2 infection and should not be used as the sole basis for treatment or other patient management decisions. A negative result may occur with  improper specimen collection/handling, submission of specimen other than nasopharyngeal swab, presence of viral mutation(s) within  the areas targeted by this assay, and inadequate number of viral copies (<131 copies/mL). A negative result must be combined with clinical observations, patient history, and epidemiological information. The expected result is Negative.  Fact Sheet for Patients:  https://www.moore.com/  Fact Sheet for Healthcare Providers:  https://www.young.biz/  This test is no t yet approved or cleared by the [place] FDA and  has been authorized for detection and/or diagnosis of SARS-CoV-2 by FDA under an Emergency Use Authorization (EUA). This EUA will remain  in effect (meaning this test can be used) for the duration of the COVID-19 declaration under Section 564(b)(1) of the Act, 21 U.S.C. section 360bbb-3(b)(1), unless the authorization is terminated or revoked sooner.     Influenza A by PCR NEGATIVE NEGATIVE Final   Influenza B by PCR NEGATIVE NEGATIVE Final    Comment: (NOTE) The Xpert Xpress SARS-CoV-2/FLU/RSV assay is intended as an aid in  the diagnosis of influenza from Nasopharyngeal swab specimens and  should not be used as a sole basis for treatment. Nasal washings and  aspirates are unacceptable for Xpert Xpress SARS-CoV-2/FLU/RSV  testing.  Fact Sheet for Patients: https://www.moore.com/  Fact Sheet for Healthcare Providers: https://www.young.biz/  This test is not yet approved or cleared by the [place] FDA and  has been authorized for detection and/or diagnosis of SARS-CoV-2 by  FDA under an Emergency Use Authorization (EUA). This EUA will remain  in effect (meaning this test can be used) for the duration of the  Covid-19 declaration under Section 564(b)(1) of the Act, 21  U.S.C. section 360bbb-3(b)(1), unless the authorization is  terminated or revoked. Performed at [facility], [address], [place], [place] [address]      Management plans discussed with the patient,  and he is in agreement. Spoke with the patient's wife earlier in the day about the syncopal episode to get more details about this.  CODE STATUS:     Code Status Orders  (From admission, onward)         Start     Ordered   01/25/20 1545  Full code  Continuous        01/25/20 1547        Code Status History    Date Active Date Inactive Code Status Order ID Comments User Context   10/20/2013 2355 10/22/2013 1532 Full Code [id number]  Frederik Schmidt, MD ED   Advance Care Planning Activity      TOTAL TIME TAKING CARE OF THIS PATIENT: 35 minutes.    Alford Highland M.D on 01/26/2020 at 1:51 PM  Between 7am to 6pm - Pager - [phone]  After 6pm go to [email or web address] - password [email or web address] [facility]  [facility]  CC: Primary care physician; Cletis Athens, MD

## 2020-01-26 NOTE — Plan of Care (Signed)
Pt is feeling well. MD reviewed pt meds and has updated based on current findings. Pt understands education on new med regimen and plan for follow up visits.

## 2020-01-26 NOTE — Plan of Care (Signed)
  Problem: Clinical Measurements: Goal: Will remain free from infection Outcome: Progressing   Problem: Education: Goal: Knowledge of General Education information will improve Description: Including pain rating scale, medication(s)/side effects and non-pharmacologic comfort measures Outcome: Progressing   Problem: Health Behavior/Discharge Planning: Goal: Ability to manage health-related needs will improve Outcome: Progressing   Problem: Clinical Measurements: Goal: Ability to maintain clinical measurements within normal limits will improve Outcome: Progressing Goal: Will remain free from infection Outcome: Progressing Goal: Diagnostic test results will improve Outcome: Progressing Goal: Respiratory complications will improve Outcome: Progressing Goal: Cardiovascular complication will be avoided Outcome: Progressing   Problem: Activity: Goal: Risk for activity intolerance will decrease Outcome: Progressing   Problem: Nutrition: Goal: Adequate nutrition will be maintained Outcome: Progressing   Problem: Coping: Goal: Level of anxiety will decrease Outcome: Progressing   Problem: Elimination: Goal: Will not experience complications related to bowel motility Outcome: Progressing Goal: Will not experience complications related to urinary retention Outcome: Progressing   Problem: Pain Managment: Goal: General experience of comfort will improve Outcome: Progressing   Problem: Safety: Goal: Ability to remain free from injury will improve Outcome: Progressing   Problem: Skin Integrity: Goal: Risk for impaired skin integrity will decrease Outcome: Progressing   

## 2020-01-26 NOTE — Progress Notes (Signed)
PHARMACIST - PHYSICIAN COMMUNICATION   CONCERNING: IV to Oral Route Change Policy  RECOMMENDATION: This patient is receiving pantoprazole by the intravenous route.  Based on criteria approved by the Pharmacy and Therapeutics Committee, the intravenous medication(s) is/are being converted to the equivalent oral dose form(s).   DESCRIPTION: These criteria include:  The patient is eating (either orally or via tube) and/or has been taking other orally administered medications for a least 24 hours  The patient has no evidence of active gastrointestinal bleeding or impaired GI absorption (gastrectomy, short bowel, patient on TNA or NPO).  If you have questions about this conversion, please contact the Pharmacy Department  []   [phone] )  ( [phone] [x]   [phone] )  [facility] []   [phone] )  [facility] []   [phone] )  [facility] []   [phone] )  [facility]   ( [phone], [facility] 01/26/2020 11:11 AM

## 2020-01-26 NOTE — Plan of Care (Signed)
All education provided and pt ready for discharge.

## 2020-01-26 NOTE — Progress Notes (Signed)
Patient ID: Jack [name]   [facility] - [place] at [facility]        Jack [name] was admitted to the Hospital on 01/25/2020 and Discharged  01/26/2020 and should be excused from work/school   for 3  days starting 01/25/2020 , may return to work/school without any restrictions.  Jack [name] M.D on 01/26/2020,at 2:01 PM  [facility] - [place] at [facility]

## 2020-01-26 NOTE — Progress Notes (Signed)
Pt home with sister. Wheeled downstairs by staff.

## 2020-02-12 ENCOUNTER — Ambulatory Visit (INDEPENDENT_AMBULATORY_CARE_PROVIDER_SITE_OTHER): Payer: BC Managed Care – PPO | Admitting: Internal Medicine

## 2020-02-12 ENCOUNTER — Other Ambulatory Visit: Payer: Self-pay

## 2020-02-12 ENCOUNTER — Encounter: Payer: Self-pay | Admitting: Internal Medicine

## 2020-02-12 VITALS — BP 118/86 | HR 77 | Ht 66.0 in | Wt 179.9 lb

## 2020-02-12 DIAGNOSIS — I350 Nonrheumatic aortic (valve) stenosis: Secondary | ICD-10-CM

## 2020-02-12 DIAGNOSIS — Z720 Tobacco use: Secondary | ICD-10-CM

## 2020-02-12 DIAGNOSIS — I10 Essential (primary) hypertension: Secondary | ICD-10-CM

## 2020-02-12 DIAGNOSIS — F419 Anxiety disorder, unspecified: Secondary | ICD-10-CM | POA: Diagnosis not present

## 2020-02-12 DIAGNOSIS — R55 Syncope and collapse: Secondary | ICD-10-CM

## 2020-02-12 DIAGNOSIS — R079 Chest pain, unspecified: Secondary | ICD-10-CM

## 2020-02-12 MED ORDER — ALPRAZOLAM 0.5 MG PO TABS: 0.5000 mg | ORAL_TABLET | Freq: Two times a day (BID) | ORAL | 0 refills | Status: DC
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
## 2020-02-14 ENCOUNTER — Ambulatory Visit: Payer: BC Managed Care – PPO | Admitting: Internal Medicine

## 2020-02-22 ENCOUNTER — Encounter: Payer: Self-pay | Admitting: Internal Medicine

## 2020-02-22 MED ORDER — POTASSIUM CHLORIDE ER 10 MEQ PO TBCR: 10.0000 meq | EXTENDED_RELEASE_TABLET | Freq: Every day | ORAL | 3 refills | Status: DC

## 2020-02-22 NOTE — Assessment & Plan Note (Signed)
There is no recurrence of the chest pain.

## 2020-02-22 NOTE — Assessment & Plan Note (Signed)
Patient was given a prescription for potassium 10 mEq p.o. daily

## 2020-02-22 NOTE — Assessment & Plan Note (Signed)
Patient heart murmur is stable at the present time.  He was advised to drink a lot of fluid.

## 2020-02-22 NOTE — Assessment & Plan Note (Signed)
-   Patient experiencing high levels of anxiety.  - Encouraged patient to engage in relaxing activities like yoga, meditation, journaling, going for a walk, or participating in a hobby.  - Encouraged patient to reach out to trusted friends or family members about recent struggles 

## 2020-02-22 NOTE — Assessment & Plan Note (Signed)
-   I instructed the patient to stop smoking and provided them with smoking cessation materials.  - I informed the patient that smoking puts them at increased risk for cancer, COPD, hypertension, and more.  - Informed the patient to seek help if they begin to have trouble breathing, develop chest pain, start to cough up blood, feel faint, or pass out.  

## 2020-03-03 ENCOUNTER — Other Ambulatory Visit: Payer: Self-pay

## 2020-03-03 ENCOUNTER — Observation Stay
Admission: EM | Admit: 2020-03-03 | Discharge: 2020-03-04 | Disposition: A | Discharge status: Home / Self Care | Payer: BC Managed Care – PPO | Attending: Family Medicine | Admitting: Family Medicine

## 2020-03-03 DIAGNOSIS — I2510 Atherosclerotic heart disease of native coronary artery without angina pectoris: Secondary | ICD-10-CM | POA: Insufficient documentation

## 2020-03-03 DIAGNOSIS — I119 Hypertensive heart disease without heart failure: Secondary | ICD-10-CM | POA: Diagnosis not present

## 2020-03-03 DIAGNOSIS — R079 Chest pain, unspecified: Secondary | ICD-10-CM | POA: Diagnosis not present

## 2020-03-03 DIAGNOSIS — Z7982 Long term (current) use of aspirin: Secondary | ICD-10-CM | POA: Insufficient documentation

## 2020-03-03 DIAGNOSIS — Z20822 Contact with and (suspected) exposure to covid-19: Secondary | ICD-10-CM | POA: Insufficient documentation

## 2020-03-03 DIAGNOSIS — R55 Syncope and collapse: Secondary | ICD-10-CM | POA: Diagnosis not present

## 2020-03-03 DIAGNOSIS — F17210 Nicotine dependence, cigarettes, uncomplicated: Secondary | ICD-10-CM | POA: Diagnosis not present

## 2020-03-03 DIAGNOSIS — S0990XA Unspecified injury of head, initial encounter: Secondary | ICD-10-CM | POA: Diagnosis not present

## 2020-03-03 DIAGNOSIS — Z79899 Other long term (current) drug therapy: Secondary | ICD-10-CM | POA: Insufficient documentation

## 2020-03-03 DIAGNOSIS — R0789 Other chest pain: Secondary | ICD-10-CM | POA: Diagnosis not present

## 2020-03-03 DIAGNOSIS — R0781 Pleurodynia: Secondary | ICD-10-CM

## 2020-03-03 LAB — CBC
HCT: 45.5 % (ref 39.0–52.0)
Hemoglobin: 15.5 g/dL (ref 13.0–17.0)
MCH: 32.4 pg (ref 26.0–34.0)
MCHC: 34.1 g/dL (ref 30.0–36.0)
MCV: 95 fL (ref 80.0–100.0)
Platelets: 170 10*3/uL (ref 150–400)
RBC: 4.79 MIL/uL (ref 4.22–5.81)
RDW: 13.2 % (ref 11.5–15.5)
WBC: 9.4 10*3/uL (ref 4.0–10.5)
nRBC: 0 % (ref 0.0–0.2)

## 2020-03-03 LAB — BASIC METABOLIC PANEL
Anion gap: 9 (ref 5–15)
BUN: 20 mg/dL (ref 6–20)
CO2: 27 mmol/L (ref 22–32)
Calcium: 9.4 mg/dL (ref 8.9–10.3)
Chloride: 108 mmol/L (ref 98–111)
Creatinine, Ser: 1.08 mg/dL (ref 0.61–1.24)
GFR, Estimated: >60 mL/min (ref >60)
Glucose, Bld: 116 mg/dL — ABNORMAL HIGH (ref 70–99)
Potassium: 4.4 mmol/L (ref 3.5–5.1)
Sodium: 144 mmol/L (ref 135–145)

## 2020-03-03 LAB — TROPONIN I (HIGH SENSITIVITY)
Troponin I (High Sensitivity): 24 ng/L — ABNORMAL HIGH (ref <18)
Troponin I (High Sensitivity): 51 ng/L — ABNORMAL HIGH (ref <18)
Troponin I (High Sensitivity): 69 ng/L — ABNORMAL HIGH (ref <18)

## 2020-03-03 LAB — RESP PANEL BY RT-PCR (FLU A&B, COVID) ARPGX2
Influenza A by PCR: NEGATIVE
Influenza B by PCR: NEGATIVE
SARS Coronavirus 2 by RT PCR: NEGATIVE

## 2020-03-03 MED ORDER — LORAZEPAM 2 MG/ML IJ SOLN
1.0000 mg | Freq: Four times a day (QID) | INTRAMUSCULAR | Status: DC | PRN
  Administered 2020-03-03 – 2020-03-04 (×2): 1 mg via INTRAVENOUS
  Filled 2020-03-03 (×2): qty 1

## 2020-03-03 MED ORDER — METOPROLOL TARTRATE 25 MG PO TABS
12.5000 mg | ORAL_TABLET | Freq: Two times a day (BID) | ORAL | Status: DC
  Administered 2020-03-03 – 2020-03-04 (×2): 12.5 mg via ORAL
  Filled 2020-03-03 (×2): qty 1

## 2020-03-03 MED ORDER — ONDANSETRON HCL 4 MG/2ML IJ SOLN: 4.0000 mg | Freq: Four times a day (QID) | INTRAMUSCULAR | Status: DC | PRN

## 2020-03-03 MED ORDER — THIAMINE HCL 100 MG PO TABS
100.0000 mg | ORAL_TABLET | Freq: Every day | ORAL | Status: DC
  Administered 2020-03-03 – 2020-03-04 (×2): 100 mg via ORAL
  Filled 2020-03-03: qty 1

## 2020-03-03 MED ORDER — ALPRAZOLAM 0.5 MG PO TABS: 0.5000 mg | ORAL_TABLET | Freq: Three times a day (TID) | ORAL | Status: DC

## 2020-03-03 MED ORDER — ALPRAZOLAM 0.5 MG PO TABS
0.5000 mg | ORAL_TABLET | Freq: Three times a day (TID) | ORAL | Status: DC | PRN
  Administered 2020-03-03: 0.5 mg via ORAL
  Filled 2020-03-03: qty 1

## 2020-03-03 MED ORDER — ACETAMINOPHEN 325 MG PO TABS: 650.0000 mg | ORAL_TABLET | ORAL | Status: DC | PRN

## 2020-03-03 MED ORDER — ASPIRIN 81 MG PO CHEW
81.0000 mg | CHEWABLE_TABLET | Freq: Every day | ORAL | Status: DC
  Administered 2020-03-03 – 2020-03-04 (×2): 81 mg via ORAL
  Filled 2020-03-03: qty 1

## 2020-03-03 MED ORDER — ROSUVASTATIN CALCIUM 20 MG PO TABS
20.0000 mg | ORAL_TABLET | Freq: Every day | ORAL | Status: DC
  Administered 2020-03-04: 20 mg via ORAL
  Filled 2020-03-03 (×2): qty 1

## 2020-03-03 MED ORDER — ENOXAPARIN SODIUM 40 MG/0.4ML ~~LOC~~ SOLN
40.0000 mg | SUBCUTANEOUS | Status: DC
  Administered 2020-03-03: 40 mg via SUBCUTANEOUS
  Filled 2020-03-03: qty 0.4

## 2020-03-03 MED ORDER — LORAZEPAM 1 MG PO TABS
1.0000 mg | ORAL_TABLET | Freq: Once | ORAL | Status: AC
  Administered 2020-03-03: 1 mg via ORAL
  Filled 2020-03-03: qty 1

## 2020-03-03 MED ORDER — ALPRAZOLAM 0.5 MG PO TABS
0.5000 mg | ORAL_TABLET | Freq: Three times a day (TID) | ORAL | Status: DC
  Administered 2020-03-03 – 2020-03-04 (×3): 0.5 mg via ORAL
  Filled 2020-03-03 (×3): qty 1

## 2020-03-03 MED ORDER — NICOTINE 21 MG/24HR TD PT24
21.0000 mg | MEDICATED_PATCH | Freq: Every day | TRANSDERMAL | Status: DC
  Filled 2020-03-03: qty 1

## 2020-03-03 NOTE — ED Notes (Signed)
Patient came by EMS has IV placed in right antecubital.
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
## 2020-03-03 NOTE — ED Triage Notes (Signed)
Pt comes via EMS from work with c/o indigestion for two days and syncopal episode while at work. Pt had silent MI in past.  Pt states no current CP and that he just needs to get back to work. EMS gave 1 nitro, 20g IV in right arm.
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
## 2020-03-04 ENCOUNTER — Observation Stay: Payer: BC Managed Care – PPO

## 2020-03-04 ENCOUNTER — Observation Stay
Admit: 2020-03-04 | Discharge: 2020-03-04 | Disposition: A | Discharge status: Home / Self Care | Payer: BC Managed Care – PPO | Attending: Internal Medicine | Admitting: Internal Medicine

## 2020-03-04 DIAGNOSIS — R7989 Other specified abnormal findings of blood chemistry: Secondary | ICD-10-CM | POA: Diagnosis not present

## 2020-03-04 DIAGNOSIS — R9431 Abnormal electrocardiogram [ECG] [EKG]: Secondary | ICD-10-CM | POA: Diagnosis not present

## 2020-03-04 DIAGNOSIS — R55 Syncope and collapse: Secondary | ICD-10-CM | POA: Diagnosis not present

## 2020-03-04 DIAGNOSIS — I350 Nonrheumatic aortic (valve) stenosis: Secondary | ICD-10-CM | POA: Diagnosis not present

## 2020-03-04 DIAGNOSIS — R079 Chest pain, unspecified: Secondary | ICD-10-CM | POA: Diagnosis not present

## 2020-03-04 LAB — ECHOCARDIOGRAM COMPLETE
AR max vel: 0.93 cm2
AV Area VTI: 1.06 cm2
AV Area mean vel: 0.96 cm2
AV Mean grad: 46.8 mmHg
AV Peak grad: 67 mmHg
Ao pk vel: 4.09 m/s
Area-P 1/2: 5.31 cm2
Height: 66 in
S' Lateral: 4.12 cm
Weight: 2878.4 oz

## 2020-03-04 LAB — TROPONIN I (HIGH SENSITIVITY): Troponin I (High Sensitivity): 44 ng/L — ABNORMAL HIGH
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
## 2020-03-04 NOTE — Progress Notes (Signed)
*  PRELIMINARY RESULTS* Echocardiogram 2D Echocardiogram has been performed.  Jack [name] 03/04/2020, 11:27 AM

## 2020-03-04 NOTE — ED Notes (Signed)
Pt handed a sandwich box , multiple gingerale drinks and multiple crackers,pts partner on phone and requesting pt to be given additional food. Ed rn gave patient x3 applesauce and a spoon and additional Gram crackers

## 2020-03-04 NOTE — ED Notes (Signed)
Food service dropped off sandwiches and ed rn  Gave patient a box lunch

## 2020-03-04 NOTE — ED Notes (Signed)
Pt is upset and emotional about his visitor not being allowed back.  I offered him to call her, pt declines and states that he feels like he is going to get up and leave, asked him to wait while we find out what the plan is and get the echo results back.

## 2020-03-04 NOTE — ED Notes (Signed)
Pt is calm and cooperative, MD states that we await echo results after which they will check with cardiology and d/c pt home, pt updated about this.

## 2020-03-04 NOTE — ED Notes (Signed)
Pt states that he is anxious and I medicated him with the ativan.  Pt notified that his significant other came to visit and that she may visit upstairs.

## 2020-03-04 NOTE — Discharge Instructions (Signed)
Advised to follow up Dr. Juliann Pares in 2 weeks. Patient found to have severe aortic stenosis on Echo. Patient needs outpatient referral to cardiothoracic surgery.
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
## 2020-03-04 NOTE — ED Notes (Signed)
Pt is sitting up in the room, he has gotten himself unhooked from monitoring and gotten dressed.  Advised him that I have called his visitor and we will let her come back but pt states that he does not want to get back on stretcher and is thinking of leaving.  MD notified

## 2020-03-04 NOTE — ED Notes (Signed)
Pt visitor who per chart and previous RN had posed an issue during night shift advised that she may visit once pt is upstairs.

## 2020-03-04 NOTE — ED Notes (Signed)
As pt wanted to see his visitor I called her and spoke with her, she is calm at this time and I told her that as he wanted to see her she may come visit, will advise staff out front.

## 2020-03-04 NOTE — ED Notes (Signed)
Pts partner called requesting medical information for patient on when he would be moved to upstairs , ed rn explained this ifo was not availbale at this time  , this ed rn unable to understand call due to slurred speech of the caller , caller yelling at ed rn ,, caller hung up on ed rn  Patient spoke with a hose keeper and asked to borrow their phone and patient partner was able to obtain housekeeping phone number. ED RN was made aware that the partner of patient continued to call and yell at housekeeping staff.

## 2020-03-04 NOTE — ED Notes (Signed)
pts wife called yelling at this rn stating pt wants a sandwich , ed rn charge made aware.

## 2020-03-04 NOTE — Discharge Summary (Signed)
Physician Discharge Summary  Jack DavidsonMichael D [name] ZOX:[id number] DOB: 04/21/1965 DOA: 03/03/2020  PCP: Corky DownsMasoud, Javed, MD  Admit date: 03/03/2020   Discharge date: 03/04/2020  Admitted From:  Home.  Disposition:  Home.  Recommendations for Outpatient Follow-up:  1. Follow up with PCP in 1-2 weeks. 2. Please obtain BMP/CBC in one week. 3. Advised to follow up Dr. Juliann Paresallwood in 2 weeks. 4. Patient found to have severe aortic stenosis on Echo. 5. Patient needs outpatient referral to cardiothoracic surgery.  Home Health: None. Equipment/Devices: None  Discharge Condition: Stable. CODE STATUS:Full code Diet recommendation: Heart Healthy   [facility]: This 54 years old male with PMH significant for CAD with remote MI and stent in 2005, hypertension, smoker, hyperlipidemia presents in the ED with recurrent chest pain and syncopal episode.  Patient reports having frequent episodes of indigestion-like chest pain for 2 days associated with lightheadedness. Last month, patient was hospitalized for similar episode, but it was found patient had orthostatic hypotension, his BP meds were cut down and he was referred to follow-up with his cardiologist which he never did.    Patient was admitted for atypical chest pain, cardiology was consulted due to elevated troponins.  Echocardiogram was completed which showed severe aortic stenosis.  Cardiology deferred Lexiscan at this time because chest pain seems more pleuritic after falling with syncope.  Patient was unwilling to stay further in the hospital.  Cleared from cardiology to be discharged.  Patient will follow up with Dr. Juliann Paresallwood within 2 weeks.  Patient will need outpatient referral to cardiothoracic surgery.  He was managed for below problems.  Discharge Diagnoses:  Active Problems:   Chest pain   Syncope  Atypical chest pain: Patient presented with chest pain with slightly elevated troponin. Cardiology consulted,  recommended  echocardiogram. Echocardiogram shows severe aortic stenosis. Patient unwilling to stay further in the hospital, he wants to leave. Cardiology states patient needs outpatient referral to cardiothoracic surgery. Lexiscan deferred at this time since chest pain seems more pleuritic. Patient is cleared from cardiology to be discharged.   Syncope -Appears to be related his blood pressure.  Check orthostatic vital signs -Continue low-dose metoprolol since patient had frequent PVCs on last admission.  Anxiety depression -Continue Xanax, outpatient psychiatry follow-up.  Cigarette smoke -Reeducated about quit -Nicotine patch   Discharge Instructions  Discharge Instructions    Call MD for:  difficulty breathing, headache or visual disturbances   Complete by: As directed    Call MD for:  persistant dizziness or light-headedness   Complete by: As directed    Call MD for:  persistant nausea and vomiting   Complete by: As directed    Call MD for:  temperature >100.4   Complete by: As directed    Diet - low sodium heart healthy   Complete by: As directed    Diet Carb Modified   Complete by: As directed    Discharge instructions   Complete by: As directed    Advised to follow up Dr. Juliann Paresallwood in 2 weeks. Patient found to have severe aortic stenosis on Echo. Patient needs outpatient referral to cardiothoracic surgery.   Increase activity slowly   Complete by: As directed      Allergies as of 03/04/2020   No Known Allergies     Medication List    STOP taking these medications   nicotine 21 mg/24hr patch Commonly known as: NICODERM CQ - dosed in mg/24 hours   potassium chloride 10 MEQ tablet Commonly known as:  KLOR-CON     TAKE these medications   ALPRAZolam 0.5 MG tablet Commonly known as: XANAX Take 1 tablet (0.5 mg total) by mouth 2 (two) times daily.   aspirin 81 MG EC tablet Take 1 tablet (81 mg total) by mouth daily. Swallow whole.   metoprolol tartrate 25 MG  tablet Commonly known as: LOPRESSOR Take 0.5 tablets (12.5 mg total) by mouth 2 (two) times daily.   rosuvastatin 20 MG tablet Commonly known as: Crestor Take 1 tablet (20 mg total) by mouth daily.   thiamine 100 MG tablet Take 1 tablet (100 mg total) by mouth daily.       Follow-up Information    [facility] EMERGENCY DEPARTMENT.   Specialty: Emergency Medicine Why: If symptoms worsen Contact information: [address] [id number] [place] [address] [phone]       Alwyn Pea, MD. Go on 03/10/2020.   Specialties: Cardiology, Internal Medicine Contact information: [address] [place] [address] [phone]        Corky Downs, MD Follow up in 1 week(s).   Specialties: Internal Medicine, Cardiology Contact information: [address] [place] [address] [phone]              No Known Allergies  Consultations:  Cardiology   Procedures/Studies: DG Chest 2 View  Result Date: 03/04/2020 CLINICAL DATA:  Indigestion and syncopal episode EXAM: CHEST - 2 VIEW COMPARISON:  11/17/2017 FINDINGS: Cardiac shadow is within normal limits. Multiple old rib fractures are noted on the left. The lungs are well aerated bilaterally without focal infiltrate or sizable effusion. No acute bony abnormality is noted. IMPRESSION: No active cardiopulmonary disease. Electronically Signed   By: Alcide Clever M.D.   On: 03/04/2020 08:48   ECHOCARDIOGRAM COMPLETE  Result Date: 03/04/2020    ECHOCARDIOGRAM REPORT   Patient Name:   Jack [name] Date of Exam: 03/04/2020 Medical Rec #:  [id number]        Height:       66.0 in Accession #:    [id number]       Weight:       179.9 lb Date of Birth:  12-23-1965         BSA:          1.912 m Patient Age:    54 years         BP:           120/62 mmHg Patient Gender: M                HR:           85 bpm. Exam Location:  [facility] Procedure: 2D Echo, Color Doppler and Cardiac  Doppler Indications:     R07.9 Chest Pain  History:         Patient has no prior history of Echocardiogram examinations.                  Previous Myocardial Infarction and CAD; Risk                  Factors:Hypertension.  Sonographer:     Humphrey Rolls RDCS (AE) Referring Phys:  [id number] Emeline General Diagnosing Phys: Marcina Millard MD IMPRESSIONS  1. Left ventricular ejection fraction, by estimation, is 60 to 65%. The left ventricle has normal function. The left ventricle has no regional wall motion abnormalities. Left ventricular diastolic parameters are consistent with Grade I diastolic dysfunction (impaired relaxation).  2. Right  ventricular systolic function is normal. The right ventricular size is normal.  3. The mitral valve is normal in structure. Mild mitral valve regurgitation. No evidence of mitral stenosis.  4. The aortic valve was not well visualized. Aortic valve regurgitation is not visualized. Severe aortic valve stenosis.  5. The inferior vena cava is normal in size with greater than 50% respiratory variability, suggesting right atrial pressure of 3 mmHg. FINDINGS  Left Ventricle: Left ventricular ejection fraction, by estimation, is 60 to 65%. The left ventricle has normal function. The left ventricle has no regional wall motion abnormalities. The left ventricular internal cavity size was normal in size. There is  no left ventricular hypertrophy. Left ventricular diastolic parameters are consistent with Grade I diastolic dysfunction (impaired relaxation). Right Ventricle: The right ventricular size is normal. No increase in right ventricular wall thickness. Right ventricular systolic function is normal. Left Atrium: Left atrial size was normal in size. Right Atrium: Right atrial size was normal in size. Pericardium: There is no evidence of pericardial effusion. Mitral Valve: The mitral valve is normal in structure. Mild mitral valve regurgitation. No evidence of mitral valve stenosis. MV peak  gradient, 5.6 mmHg. The mean mitral valve gradient is 2.0 mmHg. Tricuspid Valve: The tricuspid valve is normal in structure. Tricuspid valve regurgitation is mild . No evidence of tricuspid stenosis. Aortic Valve: The aortic valve was not well visualized. Aortic valve regurgitation is not visualized. Severe aortic stenosis is present. Aortic valve mean gradient measures 46.8 mmHg. Aortic valve peak gradient measures 67.0 mmHg. Aortic valve area, by VTI measures 1.06 cm. Pulmonic Valve: The pulmonic valve was normal in structure. Pulmonic valve regurgitation is not visualized. No evidence of pulmonic stenosis. Aorta: The aortic root is normal in size and structure. Venous: The inferior vena cava is normal in size with greater than 50% respiratory variability, suggesting right atrial pressure of 3 mmHg. IAS/Shunts: No atrial level shunt detected by color flow Doppler.  LEFT VENTRICLE PLAX 2D LVIDd:         4.88 cm  Diastology LVIDs:         4.12 cm  LV e' medial:    3.59 cm/s LV PW:         1.14 cm  LV E/e' medial:  21.3 LV IVS:        1.21 cm  LV e' lateral:   6.96 cm/s LVOT diam:     2.30 cm  LV E/e' lateral: 11.0 LV SV:         96 LV SV Index:   50 LVOT Area:     4.15 cm  RIGHT VENTRICLE RV Basal diam:  3.40 cm RV S prime:     12.80 cm/s TAPSE (M-mode): 2.7 cm LEFT ATRIUM             Index       RIGHT ATRIUM           Index LA diam:        3.40 cm 1.78 cm/m  RA Area:     15.20 cm LA Vol (A2C):   43.8 ml 22.91 ml/m RA Volume:   42.00 ml  21.97 ml/m LA Vol (A4C):   44.6 ml 23.33 ml/m LA Biplane Vol: 45.4 ml 23.75 ml/m  AORTIC VALVE                    PULMONIC VALVE AV Area (Vmax):    0.93 cm     PV Vmax:  1.36 m/s AV Area (Vmean):   0.96 cm     PV Vmean:      93.800 cm/s AV Area (VTI):     1.06 cm     PV VTI:        0.239 m AV Vmax:           409.40 cm/s  PV Peak grad:  7.4 mmHg AV Vmean:          299.600 cm/s PV Mean grad:  4.0 mmHg AV VTI:            0.902 m AV Peak Grad:      67.0 mmHg AV Mean  Grad:      46.8 mmHg LVOT Vmax:         91.40 cm/s LVOT Vmean:        68.900 cm/s LVOT VTI:          0.230 m LVOT/AV VTI ratio: 0.25  AORTA Ao Root diam: 3.30 cm MITRAL VALVE MV Area (PHT): 5.31 cm     SHUNTS MV Peak grad:  5.6 mmHg     Systemic VTI:  0.23 m MV Mean grad:  2.0 mmHg     Systemic Diam: 2.30 cm MV Vmax:       1.18 m/s MV Vmean:      63.0 cm/s MV Decel Time: 143 msec MV E velocity: 76.30 cm/s MV A velocity: 114.00 cm/s MV E/A ratio:  0.67 Marcina Millard MD Electronically signed by Marcina Millard MD Signature Date/Time: 03/04/2020/1:12:01 PM    Final     Echocardiogram   Subjective: Patient was seen and examined at bedside.  Overnight events noted.  Patient denies any chest pain.  Patient had echocardiogram shows severe aortic stenosis.  Patient is unwilling to stay further and wants to be discharged.  Discharge Exam: Vitals:   03/04/20 1149 03/04/20 1318  BP: 119/84 115/88  Pulse: 79 76  Resp: 19 18  Temp:  98.3 F (36.8 C)  SpO2: 97% 99%   Vitals:   03/04/20 0915 03/04/20 1100 03/04/20 1149 03/04/20 1318  BP: 134/90 118/86 119/84 115/88  Pulse: 72  79 76  Resp: 19 (!) 23 19 18   Temp:    98.3 F (36.8 C)  TempSrc:    Oral  SpO2: 98%  97% 99%  Weight:      Height:        General: Pt is alert, awake, not in acute distress Cardiovascular: RRR, S1/S2 +, no rubs, no gallops Respiratory: CTA bilaterally, no wheezing, no rhonchi Abdominal: Soft, NT, ND, bowel sounds + Extremities: no edema, no cyanosis    The results of significant diagnostics from this hospitalization (including imaging, microbiology, ancillary and laboratory) are listed below for reference.     Microbiology: Recent Results (from the past 240 hour(s))  Resp Panel by RT-PCR (Flu A&B, Covid) Nasopharyngeal Swab     Status: None   Collection Time: 03/03/20  2:06 PM   Specimen: Nasopharyngeal Swab; Nasopharyngeal(NP) swabs in vial transport medium  Result Value Ref Range Status   SARS  Coronavirus 2 by RT PCR NEGATIVE NEGATIVE Final    Comment: (NOTE) SARS-CoV-2 target nucleic acids are NOT DETECTED.  The SARS-CoV-2 RNA is generally detectable in upper respiratory specimens during the acute phase of infection. The lowest concentration of SARS-CoV-2 viral copies this assay can detect is 138 copies/mL. A negative result does not preclude SARS-Cov-2 infection and should not be used as the sole basis for treatment or other patient  management decisions. A negative result may occur with  improper specimen collection/handling, submission of specimen other than nasopharyngeal swab, presence of viral mutation(s) within the areas targeted by this assay, and inadequate number of viral copies(<138 copies/mL). A negative result must be combined with clinical observations, patient history, and epidemiological information. The expected result is Negative.  Fact Sheet for Patients:  BloggerCourse.com  Fact Sheet for Healthcare Providers:  SeriousBroker.it  This test is no t yet approved or cleared by the [place] FDA and  has been authorized for detection and/or diagnosis of SARS-CoV-2 by FDA under an Emergency Use Authorization (EUA). This EUA will remain  in effect (meaning this test can be used) for the duration of the COVID-19 declaration under Section 564(b)(1) of the Act, 21 U.S.C.section 360bbb-3(b)(1), unless the authorization is terminated  or revoked sooner.       Influenza A by PCR NEGATIVE NEGATIVE Final   Influenza B by PCR NEGATIVE NEGATIVE Final    Comment: (NOTE) The Xpert Xpress SARS-CoV-2/FLU/RSV plus assay is intended as an aid in the diagnosis of influenza from Nasopharyngeal swab specimens and should not be used as a sole basis for treatment. Nasal washings and aspirates are unacceptable for Xpert Xpress SARS-CoV-2/FLU/RSV testing.  Fact Sheet for  Patients: BloggerCourse.com  Fact Sheet for Healthcare Providers: SeriousBroker.it  This test is not yet approved or cleared by the [place] FDA and has been authorized for detection and/or diagnosis of SARS-CoV-2 by FDA under an Emergency Use Authorization (EUA). This EUA will remain in effect (meaning this test can be used) for the duration of the COVID-19 declaration under Section 564(b)(1) of the Act, 21 U.S.C. section 360bbb-3(b)(1), unless the authorization is terminated or revoked.  Performed at [facility], [address] [place], [place] [address]      Labs: BNP (last 3 results) No results for input(s): BNP in the last 8760 hours. Basic Metabolic Panel: Recent Labs  Lab 03/03/20 1307  NA 144  K 4.4  CL 108  CO2 27  GLUCOSE 116*  BUN 20  CREATININE 1.08  CALCIUM 9.4   Liver Function Tests: No results for input(s): AST, ALT, ALKPHOS, BILITOT, PROT, ALBUMIN in the last 168 hours. No results for input(s): LIPASE, AMYLASE in the last 168 hours. No results for input(s): AMMONIA in the last 168 hours. CBC: Recent Labs  Lab 03/03/20 1307  WBC 9.4  HGB 15.5  HCT 45.5  MCV 95.0  PLT 170   Cardiac Enzymes: No results for input(s): CKTOTAL, CKMB, CKMBINDEX, TROPONINI in the last 168 hours. BNP: Invalid input(s): POCBNP CBG: No results for input(s): GLUCAP in the last 168 hours. D-Dimer No results for input(s): DDIMER in the last 72 hours. Hgb A1c No results for input(s): HGBA1C in the last 72 hours. Lipid Profile No results for input(s): CHOL, HDL, LDLCALC, TRIG, CHOLHDL, LDLDIRECT in the last 72 hours. Thyroid function studies No results for input(s): TSH, T4TOTAL, T3FREE, THYROIDAB in the last 72 hours.  Invalid input(s): FREET3 Anemia work up No results for input(s): VITAMINB12, FOLATE, FERRITIN, TIBC, IRON, RETICCTPCT in the last 72 hours. Urinalysis    Component Value Date/Time    COLORURINE STRAW (A) 01/25/2020 1413   APPEARANCEUR CLEAR (A) 01/25/2020 1413   LABSPEC 1.004 (L) 01/25/2020 1413   PHURINE 5.0 01/25/2020 1413   GLUCOSEU NEGATIVE 01/25/2020 1413   HGBUR NEGATIVE 01/25/2020 1413   BILIRUBINUR NEGATIVE 01/25/2020 1413   KETONESUR NEGATIVE 01/25/2020 1413   PROTEINUR NEGATIVE 01/25/2020 1413  NITRITE NEGATIVE 01/25/2020 1413   LEUKOCYTESUR NEGATIVE 01/25/2020 1413   Sepsis Labs Invalid input(s): PROCALCITONIN,  WBC,  LACTICIDVEN Microbiology Recent Results (from the past 240 hour(s))  Resp Panel by RT-PCR (Flu A&B, Covid) Nasopharyngeal Swab     Status: None   Collection Time: 03/03/20  2:06 PM   Specimen: Nasopharyngeal Swab; Nasopharyngeal(NP) swabs in vial transport medium  Result Value Ref Range Status   SARS Coronavirus 2 by RT PCR NEGATIVE NEGATIVE Final    Comment: (NOTE) SARS-CoV-2 target nucleic acids are NOT DETECTED.  The SARS-CoV-2 RNA is generally detectable in upper respiratory specimens during the acute phase of infection. The lowest concentration of SARS-CoV-2 viral copies this assay can detect is 138 copies/mL. A negative result does not preclude SARS-Cov-2 infection and should not be used as the sole basis for treatment or other patient management decisions. A negative result may occur with  improper specimen collection/handling, submission of specimen other than nasopharyngeal swab, presence of viral mutation(s) within the areas targeted by this assay, and inadequate number of viral copies(<138 copies/mL). A negative result must be combined with clinical observations, patient history, and epidemiological information. The expected result is Negative.  Fact Sheet for Patients:  BloggerCourse.com  Fact Sheet for Healthcare Providers:  SeriousBroker.it  This test is no t yet approved or cleared by the [place] FDA and  has been authorized for detection and/or diagnosis of  SARS-CoV-2 by FDA under an Emergency Use Authorization (EUA). This EUA will remain  in effect (meaning this test can be used) for the duration of the COVID-19 declaration under Section 564(b)(1) of the Act, 21 U.S.C.section 360bbb-3(b)(1), unless the authorization is terminated  or revoked sooner.       Influenza A by PCR NEGATIVE NEGATIVE Final   Influenza B by PCR NEGATIVE NEGATIVE Final    Comment: (NOTE) The Xpert Xpress SARS-CoV-2/FLU/RSV plus assay is intended as an aid in the diagnosis of influenza from Nasopharyngeal swab specimens and should not be used as a sole basis for treatment. Nasal washings and aspirates are unacceptable for Xpert Xpress SARS-CoV-2/FLU/RSV testing.  Fact Sheet for Patients: BloggerCourse.com  Fact Sheet for Healthcare Providers: SeriousBroker.it  This test is not yet approved or cleared by the [place] FDA and has been authorized for detection and/or diagnosis of SARS-CoV-2 by FDA under an Emergency Use Authorization (EUA). This EUA will remain in effect (meaning this test can be used) for the duration of the COVID-19 declaration under Section 564(b)(1) of the Act, 21 U.S.C. section 360bbb-3(b)(1), unless the authorization is terminated or revoked.  Performed at [facility], [address], [place], [place] [address]      Time coordinating discharge: Over 30 minutes  SIGNED:   Cipriano Bunker, MD  [facility] 03/04/2020, 1:34 PM Pager   If 7PM-7AM, please contact night-coverage [email or web address]

## 2020-03-11 DIAGNOSIS — I10 Essential (primary) hypertension: Secondary | ICD-10-CM | POA: Diagnosis not present

## 2020-03-11 DIAGNOSIS — E7800 Pure hypercholesterolemia, unspecified: Secondary | ICD-10-CM | POA: Diagnosis not present

## 2020-03-11 DIAGNOSIS — R002 Palpitations: Secondary | ICD-10-CM | POA: Diagnosis not present

## 2020-03-11 DIAGNOSIS — I2510 Atherosclerotic heart disease of native coronary artery without angina pectoris: Secondary | ICD-10-CM | POA: Diagnosis not present

## 2020-03-16 ENCOUNTER — Ambulatory Visit: Payer: BC Managed Care – PPO | Admitting: Internal Medicine

## 2020-03-16 ENCOUNTER — Other Ambulatory Visit: Payer: Self-pay | Admitting: Cardiology

## 2020-03-16 ENCOUNTER — Other Ambulatory Visit
Admission: RE | Admit: 2020-03-16 | Discharge: 2020-03-16 | Disposition: A | Discharge status: Home / Self Care | Payer: BC Managed Care – PPO | Source: Ambulatory Visit | Attending: Cardiology | Admitting: Cardiology

## 2020-03-16 ENCOUNTER — Other Ambulatory Visit: Payer: Self-pay

## 2020-03-16 DIAGNOSIS — R011 Cardiac murmur, unspecified: Secondary | ICD-10-CM | POA: Diagnosis not present

## 2020-03-16 DIAGNOSIS — Z01812 Encounter for preprocedural laboratory examination: Secondary | ICD-10-CM | POA: Insufficient documentation

## 2020-03-16 DIAGNOSIS — I513 Intracardiac thrombosis, not elsewhere classified: Secondary | ICD-10-CM | POA: Diagnosis not present

## 2020-03-16 DIAGNOSIS — Z20822 Contact with and (suspected) exposure to covid-19: Secondary | ICD-10-CM | POA: Insufficient documentation

## 2020-03-16 DIAGNOSIS — I10 Essential (primary) hypertension: Secondary | ICD-10-CM | POA: Diagnosis not present

## 2020-03-16 DIAGNOSIS — Q231 Congenital insufficiency of aortic valve: Secondary | ICD-10-CM | POA: Diagnosis not present

## 2020-03-17 ENCOUNTER — Ambulatory Visit: Payer: BC Managed Care – PPO | Admitting: Registered Nurse

## 2020-03-17 ENCOUNTER — Ambulatory Visit: Payer: BC Managed Care – PPO | Admitting: Internal Medicine

## 2020-03-17 ENCOUNTER — Encounter: Payer: Self-pay | Admitting: Cardiology

## 2020-03-17 ENCOUNTER — Encounter
Admission: RE | Disposition: A | Discharge status: Home / Self Care | Payer: Self-pay | Source: Home / Self Care | Attending: Cardiology

## 2020-03-17 ENCOUNTER — Ambulatory Visit
Admit: 2020-03-17 | Discharge: 2020-03-17 | Disposition: A | Discharge status: Home / Self Care | Payer: BC Managed Care – PPO | Attending: Cardiology | Admitting: Cardiology

## 2020-03-17 ENCOUNTER — Ambulatory Visit
Admission: RE | Admit: 2020-03-17 | Discharge: 2020-03-17 | Disposition: A | Discharge status: Home / Self Care | Payer: BC Managed Care – PPO | Source: Home / Self Care | Attending: Cardiology | Admitting: Cardiology

## 2020-03-17 ENCOUNTER — Other Ambulatory Visit: Payer: Self-pay

## 2020-03-17 DIAGNOSIS — Z7982 Long term (current) use of aspirin: Secondary | ICD-10-CM | POA: Insufficient documentation

## 2020-03-17 DIAGNOSIS — Q231 Congenital insufficiency of aortic valve: Secondary | ICD-10-CM | POA: Insufficient documentation

## 2020-03-17 DIAGNOSIS — R55 Syncope and collapse: Secondary | ICD-10-CM | POA: Insufficient documentation

## 2020-03-17 DIAGNOSIS — R011 Cardiac murmur, unspecified: Secondary | ICD-10-CM | POA: Diagnosis not present

## 2020-03-17 DIAGNOSIS — I10 Essential (primary) hypertension: Secondary | ICD-10-CM | POA: Insufficient documentation

## 2020-03-17 DIAGNOSIS — E669 Obesity, unspecified: Secondary | ICD-10-CM | POA: Insufficient documentation

## 2020-03-17 DIAGNOSIS — E785 Hyperlipidemia, unspecified: Secondary | ICD-10-CM | POA: Insufficient documentation

## 2020-03-17 DIAGNOSIS — K219 Gastro-esophageal reflux disease without esophagitis: Secondary | ICD-10-CM | POA: Insufficient documentation

## 2020-03-17 DIAGNOSIS — Z20822 Contact with and (suspected) exposure to covid-19: Secondary | ICD-10-CM | POA: Diagnosis not present

## 2020-03-17 DIAGNOSIS — Z79899 Other long term (current) drug therapy: Secondary | ICD-10-CM | POA: Insufficient documentation

## 2020-03-17 DIAGNOSIS — I513 Intracardiac thrombosis, not elsewhere classified: Secondary | ICD-10-CM | POA: Diagnosis not present

## 2020-03-17 DIAGNOSIS — Z6828 Body mass index (BMI) 28.0-28.9, adult: Secondary | ICD-10-CM | POA: Insufficient documentation

## 2020-03-17 DIAGNOSIS — I350 Nonrheumatic aortic (valve) stenosis: Secondary | ICD-10-CM | POA: Diagnosis not present

## 2020-03-17 DIAGNOSIS — I359 Nonrheumatic aortic valve disorder, unspecified: Secondary | ICD-10-CM | POA: Diagnosis not present

## 2020-03-17 DIAGNOSIS — F17210 Nicotine dependence, cigarettes, uncomplicated: Secondary | ICD-10-CM | POA: Insufficient documentation

## 2020-03-17 HISTORY — PX: TEE WITHOUT CARDIOVERSION: SHX5443

## 2020-03-17 HISTORY — DX: Hyperlipidemia, unspecified: E78.5

## 2020-03-17 LAB — SARS CORONAVIRUS 2 (TAT 6-24 HRS): SARS Coronavirus 2: NEGATIVE

## 2020-03-17 SURGERY — ECHOCARDIOGRAM, TRANSESOPHAGEAL
Anesthesia: General

## 2020-03-17 MED ORDER — PROPOFOL 10 MG/ML IV BOLUS
INTRAVENOUS | Status: DC | PRN
  Administered 2020-03-17: 20 mg via INTRAVENOUS
  Administered 2020-03-17 (×2): 30 mg via INTRAVENOUS
  Administered 2020-03-17: 20 mg via INTRAVENOUS
  Administered 2020-03-17: 50 mg via INTRAVENOUS
  Administered 2020-03-17 (×3): 20 mg via INTRAVENOUS

## 2020-03-17 MED ORDER — LIDOCAINE VISCOUS HCL 2 % MT SOLN
OROMUCOSAL | Status: AC
  Filled 2020-03-17: qty 15

## 2020-03-17 MED ORDER — SODIUM CHLORIDE FLUSH 0.9 % IV SOLN
INTRAVENOUS | Status: AC
  Filled 2020-03-17: qty 10

## 2020-03-17 MED ORDER — BUTAMBEN-TETRACAINE-BENZOCAINE 2-2-14 % EX AERO
INHALATION_SPRAY | CUTANEOUS | Status: AC
  Filled 2020-03-17: qty 5

## 2020-03-17 MED ORDER — SODIUM CHLORIDE 0.9 % IV SOLN: INTRAVENOUS | Status: DC

## 2020-03-17 NOTE — Anesthesia Preprocedure Evaluation (Signed)
Anesthesia Evaluation  Patient identified by MRN, date of birth, ID band Patient awake    Reviewed: Allergy & Precautions, H&P , NPO status , Patient's Chart, lab work & pertinent test results, reviewed documented beta blocker date and time   History of Anesthesia Complications Negative for: history of anesthetic complications  Airway Mallampati: I  TM Distance: >3 FB Neck ROM: full    Dental  (+) Edentulous Upper, Upper Dentures, Dental Advidsory Given   Pulmonary neg pulmonary ROS, former smoker,    Pulmonary exam normal breath sounds clear to auscultation       Cardiovascular Exercise Tolerance: Good hypertension, (-) angina+ CAD, + Past MI and + Cardiac Stents  (-) CABG Normal cardiovascular exam(-) dysrhythmias + Valvular Problems/Murmurs  Rhythm:regular Rate:Normal     Neuro/Psych PSYCHIATRIC DISORDERS Anxiety negative neurological ROS     GI/Hepatic Neg liver ROS, GERD  ,  Endo/Other  negative endocrine ROS  Renal/GU negative Renal ROS  negative genitourinary   Musculoskeletal   Abdominal   Peds  Hematology negative hematology ROS (+)   Anesthesia Other Findings Past Medical History: No date: Anginal pain (HCC) No date: Coronary artery disease No date: Heart murmur No date: Hyperlipidemia No date: Hypertension No date: Myocardial infarction (HCC)   Reproductive/Obstetrics negative OB ROS                             Anesthesia Physical Anesthesia Plan  ASA: II  Anesthesia Plan: General   Post-op Pain Management:    Induction: Intravenous  PONV Risk Score and Plan: 2 and Propofol infusion and TIVA  Airway Management Planned: Natural Airway and Nasal Cannula  Additional Equipment:   Intra-op Plan:   Post-operative Plan:   Informed Consent: I have reviewed the patients History and Physical, chart, labs and discussed the procedure including the risks, benefits and  alternatives for the proposed anesthesia with the patient or authorized representative who has indicated his/her understanding and acceptance.     Dental Advisory Given  Plan Discussed with: Anesthesiologist, CRNA and Surgeon  Anesthesia Plan Comments:         Anesthesia Quick Evaluation

## 2020-03-17 NOTE — Procedures (Signed)
   TRANSESOPHAGEAL ECHOCARDIOGRAM   NAME:  ADRIANE GUGLIELMO   MRN: [id number] DOB:  05/24/1965   ADMIT DATE: 03/17/2020  INDICATIONS:   PROCEDURE:   Informed consent was obtained prior to the procedure. The risks, benefits and alternatives for the procedure were discussed and the patient comprehended these risks.  Risks include, but are not limited to, cough, sore throat, vomiting, nausea, somnolence, esophageal and stomach trauma or perforation, bleeding, low blood pressure, aspiration, pneumonia, infection, trauma to the teeth and death.    After a procedural time-out, the patient was sedated per department of anesthesia.   The transesophageal probe was inserted in the esophagus and stomach without difficulty and multiple views were obtained. I was present for the entire procedure.    COMPLICATIONS:    There were no immediate complications.  FINDINGS:  LEFT VENTRICLE: EF = 55%. No regional wall motion abnormalities.  RIGHT VENTRICLE: Normal size and function.   LEFT ATRIUM: Normal size.  LEFT ATRIAL APPENDAGE: No thrombus.   RIGHT ATRIUM: Normal size  AORTIC VALVE:  Bileaflet with mild ai  MITRAL VALVE:    Normal. Mild mr  TRICUSPID VALVE: Normal. Trivial tr  PULMONIC VALVE: Grossly normal.  INTERATRIAL SEPTUM: No PFO or ASD. Agitated saline contrast was used.   PERICARDIUM: No effusion  DESCENDING AORTA: Upper limits of normal   CONCLUSION: Bicuspid aortic valve No dissection seen. Aortic root upper limits of normal

## 2020-03-17 NOTE — Discharge Instructions (Signed)
Moderate Conscious Sedation, Adult, Care After These instructions provide you with information about caring for yourself after your procedure. Your health care provider may also give you more specific instructions. Your treatment has been planned according to current medical practices, but problems sometimes occur. Call your health care provider if you have any problems or questions after your procedure. What can I expect after the procedure? After your procedure, it is common:  To feel sleepy for several hours.  To feel clumsy and have poor balance for several hours.  To have poor judgment for several hours.  To vomit if you eat too soon. Follow these instructions at home: For at least 24 hours after the procedure:   Do not: ? Participate in activities where you could fall or become injured. ? Drive. ? Use heavy machinery. ? Drink alcohol. ? Take sleeping pills or medicines that cause drowsiness. ? Make important decisions or sign legal documents. ? Take care of children on your own.  Rest. Eating and drinking  Follow the diet recommended by your health care provider.  If you vomit: ? Drink water, juice, or soup when you can drink without vomiting. ? Make sure you have little or no nausea before eating solid foods. General instructions  Have a responsible adult stay with you until you are awake and alert.  Take over-the-counter and prescription medicines only as told by your health care provider.  If you smoke, do not smoke without supervision.  Keep all follow-up visits as told by your health care provider. This is important. Contact a health care provider if:  You keep feeling nauseous or you keep vomiting.  You feel light-headed.  You develop a rash.  You have a fever. Get help right away if:  You have trouble breathing. This information is not intended to replace advice given to you by your health care provider. Make sure you discuss any questions you have  with your health care provider. Document Revised: 02/10/2017 Document Reviewed: 06/20/2015 Elsevier Patient Education  2020 Else[employer] Transesophageal Echocardiogram Transesophageal echocardiogram (TEE) is a test that uses sound waves to take pictures of your heart. TEE is done by passing a flexible tube down the esophagus. The esophagus is the tube that carries food from the throat to the stomach. The pictures give detailed images of your heart. This can help your doctor see if there are problems with your heart. What happens before the procedure? Staying hydrated Follow instructions from your doctor about hydration, which may include:  Up to 3 hours before the procedure - you may continue to drink clear liquids, such as: ? Water. ? Clear fruit juice. ? Black coffee. ? Plain tea.  Eating and drinking Follow instructions from your doctor about eating and drinking, which may include:  8 hours before the procedure - stop eating heavy meals or foods such as meat, fried foods, or fatty foods.  6 hours before the procedure - stop eating light meals or foods, such as toast or cereal.  6 hours before the procedure - stop drinking milk or drinks that contain milk.  3 hours before the procedure - stop drinking clear liquids. General instructions  You will need to take out any dentures or retainers.  Plan to have someone take you home from the hospital or clinic.  If you will be going home right after the procedure, plan to have someone with you for 24 hours.  Ask your doctor about: ? Changing or stopping your normal medicines. This is   important if you take diabetes medicines or blood thinners. ? Taking over-the-counter medicines, vitamins, herbs, and supplements. ? Taking medicines such as aspirin and ibuprofen. These medicines can thin your blood. Do not take these medicines unless your doctor tells you to take them. What happens during the procedure?  To lower your risk of  infection, your doctors will wash or clean their hands.  An IV will be put into one of your veins.  You will be given a medicine to help you relax (sedative).  A medicine may be sprayed or gargled. This numbs the back of your throat.  Your blood pressure, heart rate, and breathing will be watched.  You may be asked to lay on your left side.  A bite block will be placed in your mouth. This keeps you from biting the tube.  The tip of the TEE probe will be placed into the back of your mouth.  You will be asked to swallow.  Your doctor will take pictures of your heart.  The probe and bite block will be taken out. The procedure may vary among doctors and hospitals. What happens after the procedure?   Your blood pressure, heart rate, breathing rate, and blood oxygen level will be watched until the medicines you were given have worn off.  When you first wake up, your throat may feel sore and numb. This will get better over time. You will not be allowed to eat or drink until the numbness has gone away.  Do not drive for 24 hours if you were given a medicine to help you relax. Summary  TEE is a test that uses sound waves to take pictures of your heart.  You will be given a medicine to help you relax.  Do not drive for 24 hours if you were given a medicine to help you relax. This information is not intended to replace advice given to you by your health care provider. Make sure you discuss any questions you have with your health care provider. Document Revised: 11/17/2017 Document Reviewed: 06/01/2016 Elsevier Patient Education  2020 Elsevier Inc.  

## 2020-03-17 NOTE — H&P (Signed)
Chief Complaint  Patient presents with  . Follow-up  [facility], SYNCOPE, VALVE REPLACEMENT  Date of Service: 03/11/2020 Date of Birth: 1965/05/03 PCP: Corky Downs, MD  History of Present Illness: Mr. Jack [name] is a 55 y.o.male patient who history of multiple medical problems coronary disease myocardial infarction PCI and stent substance abuse GERD hypertension hyperlipidemia recently had few episodes of syncope requiring hospitalization x2 patient had been lost to cardiology follow-up for several years when he was recently diagnosed with moderate to severe aortic stenosis and advised to follow-up unfortunately did not now he presented with syncope x2 1 in November and another one in December echocardiogram emergency room suggested severe aortic stenosis requiring further evaluation is some question about a bicuspid arctic valve as well patient states he has no prodrome he just passes out he has had no injury denies any significant palpitations or tachycardia or chest pain now here for further cardiac assessment  Past Medical and Surgical History  Past Medical History Past Medical History:  Diagnosis Date  . Angina pectoris (CMS-HCC)  . Coronary disease  . GERD (gastroesophageal reflux disease)  . Heart murmur, unspecified  . Hyperlipidemia  . Hypertension  . Mild obesity, unspecified  . Myocardial infarction (CMS-HCC)  . Obesity  . Valvular heart disease   Past Surgical History He has a past surgical history that includes Cardiac catheterization.   Medications and Allergies  Current Medications  Current Outpatient Medications  Medication Sig Dispense Refill  . ALPRAZolam (XANAX) 0.5 MG tablet TAKE 1 TABLET BY MOUTH DAILY AS NEEDED FOR ANXIETY 5  . aspirin 81 MG EC tablet Take by mouth  . lovastatin (MEVACOR) 20 MG tablet Take 1 tablet (20 mg total) by mouth daily with dinner 90 tablet 3  . metoprolol tartrate (LOPRESSOR) 25 MG tablet Take 1 tablet (25 mg total) by mouth 2 (two) times daily  180 tablet 3  . nitroGLYcerin (NITROSTAT) 0.4 MG SL tablet Place 1 tablet (0.4 mg total) under the tongue every 5 (five) minutes as needed for Chest pain May take up to 3 doses. 25 tablet 2  . rosuvastatin (CRESTOR) 20 MG tablet  . thiamine (VITAMIN B-1) 100 MG tablet Take 100 mg by mouth once daily  . lisinopril-hydrochlorothiazide (PRINZIDE,ZESTORETIC) 20-25 mg tablet Take 1 tablet by mouth once daily 90 tablet 3  . oxyCODONE-acetaminophen (PERCOCET) 10-325 mg tablet Take 1 tablet by mouth every 6 (six) hours as needed for Pain (Patient not taking: Reported on 03/11/2020 ) 0   No current facility-administered medications for this visit.   Allergies: Patient has no known allergies.  Social and Family History  Social History reports that he has been smoking cigarettes. He has never used smokeless tobacco. He reports current alcohol use of about 2.0 standard drinks of alcohol per week.  Family History Family History  Problem Relation Age of Onset  . Heart disease Mother  . Coronary Artery Disease (Blocked arteries around heart) Mother  . Diabetes type II Mother  . High blood pressure (Hypertension) Mother  . Heart disease Father  . Coronary Artery Disease (Blocked arteries around heart) Father  . High blood pressure (Hypertension) Father   Review of Systems   Review of Systems: The patient denies chest pain, shortness of breath, orthopnea, paroxysmal nocturnal dyspnea, pedal edema, palpitations, heart racing, presyncope, syncope. Review of 12 Systems is negative except as described above.  Physical Examination   Vitals:BP 122/76  Pulse 73  Ht 167.6 cm (5\' 6" )  Wt 80.3 kg (177 lb)  SpO2 96%  BMI 28.57 kg/m  Ht:167.6 cm (5\' 6" ) Wt:80.3 kg (177 lb) surface area is 1.93 meters squared. Body mass index is 28.57 kg/m.  HEENT: Pupils equally reactive to light and accomodation  Neck: Supple without thyromegaly, carotid pulses 2+ Lungs: clear to auscultation bilaterally;  no wheezes, rales, rhonchi Heart: Regular rate and rhythm. No gallops, 3/6 sem murmurs or rub Abdomen: soft nontender, nondistended, with normal bowel sounds Extremities: no cyanosis, clubbing, or edema Peripheral Pulses: 2+ in all extremities, 2+ femoral pulses bilaterally  Assessment   55 y.o. male with  1. Palpitation  2. Primary hypertension  3. Coronary artery disease involving native coronary artery of native heart without angina pectoris  4. Pure hypercholesterolemia  5. Medication management  6. Preop testing  7. Syncope, unspecified syncope type  8. Nonrheumatic aortic valve stenosis  9. Bicuspid aortic valve  10. Status post angioplasty with stent  11. History of myocardial infarction  12. Gastroesophageal reflux disease without esophagitis  13. Aortic ejection murmur   Plan  1 recurrent syncope thought to be related to possibly severe aortic stenosis would recommend further evaluation 2 aortic stenosis by echocardiogram would recommend TEE 1st months of possible bicuspid aortic valve 3 recommend right and left heart cath for aortic stenosis evaluation as well as coronary disease 4 consider CT of the chest for aortopathy if he is found to have bicuspid aortic valve 5 hypertension continue current management currently on Lopressor 12.5 twice a day 6 hyperlipidemia patient currently maintained on Crestor therapy for lipid management 7 GERD symptoms mild recurrent would recommend omeprazole or Pepcid for reflux type symptoms 8 obesity modest recommend modest weight loss exercise portion control 9 history of substance abuse advised patient refrain from substance abuse 10 have the patient return for TEE next week with Dr. 40 11 follow-up shortly with cardiac cath right and left heart for aortic valve evaluation as well as coronary disease 12 return to work in 1 month 04/13/2020 13 return to the office in 1 month just prior to being released to return to work   04/15/2020, MD  Pt seen and examined. No change from above.

## 2020-03-17 NOTE — Progress Notes (Signed)
*  PRELIMINARY RESULTS* Echocardiogram Echocardiogram Transesophageal has been performed.  Jack [name] 03/17/2020, 8:36 AM

## 2020-03-17 NOTE — Transfer of Care (Signed)
Immediate Anesthesia Transfer of Care Note  Patient: Jack [name]  Procedure(s) Performed: TRANSESOPHAGEAL ECHOCARDIOGRAM (TEE) (N/A )  Patient Location: PACU  Anesthesia Type:General  Level of Consciousness: awake, alert  and oriented  Airway & Oxygen Therapy: Patient Spontanous Breathing  Post-op Assessment: Report given to RN and Post -op Vital signs reviewed and stable  Post vital signs: Reviewed and stable  Last Vitals:  Vitals Value Taken Time  BP 128/88 03/17/20 0823  Temp    Pulse 69 03/17/20 0824  Resp 19 03/17/20 0824  SpO2 97 % 03/17/20 0824  Vitals shown include unvalidated device data.  Last Pain:  Vitals:   03/17/20 0743  TempSrc: Oral  PainSc: 0-No pain         Complications: No complications documented.

## 2020-03-18 ENCOUNTER — Encounter: Payer: Self-pay | Admitting: Cardiology

## 2020-03-18 NOTE — Anesthesia Postprocedure Evaluation (Signed)
Anesthesia Post Note  Patient: Jack [name]  Procedure(s) Performed: TRANSESOPHAGEAL ECHOCARDIOGRAM (TEE) (N/A )  Patient location during evaluation: Specials Recovery Anesthesia Type: General Level of consciousness: awake and alert Pain management: pain level controlled Vital Signs Assessment: post-procedure vital signs reviewed and stable Respiratory status: spontaneous breathing, nonlabored ventilation, respiratory function stable and patient connected to nasal cannula oxygen Cardiovascular status: blood pressure returned to baseline and stable Postop Assessment: no apparent nausea or vomiting Anesthetic complications: no   No complications documented.   Last Vitals:  Vitals:   03/17/20 0845 03/17/20 0900  BP: 101/87 120/88  Pulse: 69 60  Resp: 13 17  Temp:    SpO2: 99% 99%    Last Pain:  Vitals:   03/17/20 0900  TempSrc:   PainSc: 0-No pain                 Lenard Simmer

## 2020-03-23 DIAGNOSIS — Q231 Congenital insufficiency of aortic valve: Secondary | ICD-10-CM | POA: Diagnosis not present

## 2020-03-23 DIAGNOSIS — R55 Syncope and collapse: Secondary | ICD-10-CM | POA: Diagnosis not present

## 2020-03-23 DIAGNOSIS — I2510 Atherosclerotic heart disease of native coronary artery without angina pectoris: Secondary | ICD-10-CM | POA: Diagnosis not present

## 2020-03-23 DIAGNOSIS — I350 Nonrheumatic aortic (valve) stenosis: Secondary | ICD-10-CM | POA: Diagnosis not present

## 2020-04-01 ENCOUNTER — Other Ambulatory Visit
Admission: RE | Admit: 2020-04-01 | Discharge: 2020-04-01 | Disposition: A | Discharge status: Home / Self Care | Payer: BC Managed Care – PPO | Source: Ambulatory Visit | Attending: Internal Medicine | Admitting: Internal Medicine

## 2020-04-01 DIAGNOSIS — I10 Essential (primary) hypertension: Secondary | ICD-10-CM | POA: Diagnosis not present

## 2020-04-01 DIAGNOSIS — Z01818 Encounter for other preprocedural examination: Secondary | ICD-10-CM | POA: Insufficient documentation

## 2020-04-01 DIAGNOSIS — E7800 Pure hypercholesterolemia, unspecified: Secondary | ICD-10-CM | POA: Diagnosis not present

## 2020-04-01 DIAGNOSIS — I2510 Atherosclerotic heart disease of native coronary artery without angina pectoris: Secondary | ICD-10-CM | POA: Insufficient documentation

## 2020-04-01 LAB — BRAIN NATRIURETIC PEPTIDE: B Natriuretic Peptide: 305 pg/mL — ABNORMAL HIGH (ref 0.0–100.0)

## 2020-04-06 ENCOUNTER — Ambulatory Visit (INDEPENDENT_AMBULATORY_CARE_PROVIDER_SITE_OTHER): Payer: BC Managed Care – PPO | Admitting: Internal Medicine

## 2020-04-06 ENCOUNTER — Other Ambulatory Visit
Admission: RE | Admit: 2020-04-06 | Discharge: 2020-04-06 | Disposition: A | Discharge status: Home / Self Care | Payer: BC Managed Care – PPO | Source: Ambulatory Visit | Attending: Internal Medicine | Admitting: Internal Medicine

## 2020-04-06 ENCOUNTER — Encounter: Payer: Self-pay | Admitting: Internal Medicine

## 2020-04-06 ENCOUNTER — Other Ambulatory Visit: Payer: Self-pay

## 2020-04-06 VITALS — BP 152/101 | HR 82 | Ht 66.0 in | Wt 189.6 lb

## 2020-04-06 DIAGNOSIS — Z01812 Encounter for preprocedural laboratory examination: Secondary | ICD-10-CM | POA: Insufficient documentation

## 2020-04-06 DIAGNOSIS — R55 Syncope and collapse: Secondary | ICD-10-CM

## 2020-04-06 DIAGNOSIS — U071 COVID-19: Secondary | ICD-10-CM | POA: Insufficient documentation

## 2020-04-06 DIAGNOSIS — F419 Anxiety disorder, unspecified: Secondary | ICD-10-CM

## 2020-04-06 DIAGNOSIS — I350 Nonrheumatic aortic (valve) stenosis: Secondary | ICD-10-CM | POA: Insufficient documentation

## 2020-04-06 DIAGNOSIS — Z720 Tobacco use: Secondary | ICD-10-CM | POA: Diagnosis not present

## 2020-04-06 DIAGNOSIS — Q230 Congenital stenosis of aortic valve: Secondary | ICD-10-CM | POA: Diagnosis not present

## 2020-04-06 DIAGNOSIS — Q231 Congenital insufficiency of aortic valve: Secondary | ICD-10-CM

## 2020-04-06 MED ORDER — ALPRAZOLAM 0.5 MG PO TABS: 0.5000 mg | ORAL_TABLET | Freq: Two times a day (BID) | ORAL | 1 refills | Status: DC | PRN
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
## 2020-04-06 NOTE — Assessment & Plan Note (Signed)
-   Patient experiencing high levels of anxiety.  - Encouraged patient to engage in relaxing activities like yoga, meditation, journaling, going for a walk, or participating in a hobby.  - Encouraged patient to reach out to trusted friends or family members about recent struggles 

## 2020-04-06 NOTE — Assessment & Plan Note (Signed)
-   I instructed the patient to stop smoking and provided them with smoking cessation materials.  - I informed the patient that smoking puts them at increased risk for cancer, COPD, hypertension, and more.  - Informed the patient to seek help if they begin to have trouble breathing, develop chest pain, start to cough up blood, feel faint, or pass out.  

## 2020-04-06 NOTE — Assessment & Plan Note (Signed)
Possibly related to aortic valve disease.

## 2020-04-06 NOTE — Assessment & Plan Note (Signed)
Patient be referred to Dr. Juliann Pares for evaluation.  He related to cath  andTee

## 2020-04-07 LAB — SARS CORONAVIRUS 2 (TAT 6-24 HRS): SARS Coronavirus 2: POSITIVE — AB

## 2020-04-08 ENCOUNTER — Ambulatory Visit
Admission: RE | Admit: 2020-04-08 | Payer: BC Managed Care – PPO | Source: Home / Self Care | Admitting: Internal Medicine

## 2020-04-08 ENCOUNTER — Encounter: Admission: RE | Payer: Self-pay | Source: Home / Self Care

## 2020-04-08 DIAGNOSIS — Q231 Congenital insufficiency of aortic valve: Secondary | ICD-10-CM

## 2020-04-08 SURGERY — RIGHT/LEFT HEART CATH AND CORONARY ANGIOGRAPHY
Anesthesia: Moderate Sedation | Laterality: Bilateral

## 2020-04-29 ENCOUNTER — Ambulatory Visit
Admission: RE | Admit: 2020-04-29 | Discharge: 2020-04-29 | Disposition: A | Discharge status: Home / Self Care | Payer: BC Managed Care – PPO | Attending: Internal Medicine | Admitting: Internal Medicine

## 2020-04-29 ENCOUNTER — Other Ambulatory Visit: Payer: Self-pay

## 2020-04-29 ENCOUNTER — Encounter
Admission: RE | Disposition: A | Discharge status: Home / Self Care | Payer: Self-pay | Source: Home / Self Care | Attending: Internal Medicine

## 2020-04-29 ENCOUNTER — Encounter: Payer: Self-pay | Admitting: Internal Medicine

## 2020-04-29 DIAGNOSIS — Z8616 Personal history of COVID-19: Secondary | ICD-10-CM | POA: Insufficient documentation

## 2020-04-29 DIAGNOSIS — I2510 Atherosclerotic heart disease of native coronary artery without angina pectoris: Secondary | ICD-10-CM | POA: Insufficient documentation

## 2020-04-29 DIAGNOSIS — F17200 Nicotine dependence, unspecified, uncomplicated: Secondary | ICD-10-CM | POA: Diagnosis not present

## 2020-04-29 DIAGNOSIS — I2582 Chronic total occlusion of coronary artery: Secondary | ICD-10-CM | POA: Diagnosis not present

## 2020-04-29 DIAGNOSIS — Z79899 Other long term (current) drug therapy: Secondary | ICD-10-CM | POA: Insufficient documentation

## 2020-04-29 DIAGNOSIS — Z955 Presence of coronary angioplasty implant and graft: Secondary | ICD-10-CM | POA: Insufficient documentation

## 2020-04-29 DIAGNOSIS — Z7982 Long term (current) use of aspirin: Secondary | ICD-10-CM | POA: Insufficient documentation

## 2020-04-29 DIAGNOSIS — I350 Nonrheumatic aortic (valve) stenosis: Secondary | ICD-10-CM | POA: Diagnosis not present

## 2020-04-29 DIAGNOSIS — I25110 Atherosclerotic heart disease of native coronary artery with unstable angina pectoris: Secondary | ICD-10-CM | POA: Diagnosis not present

## 2020-04-29 DIAGNOSIS — R55 Syncope and collapse: Secondary | ICD-10-CM | POA: Diagnosis not present

## 2020-04-29 HISTORY — PX: RIGHT/LEFT HEART CATH AND CORONARY ANGIOGRAPHY: CATH118266

## 2020-04-29 SURGERY — RIGHT/LEFT HEART CATH AND CORONARY ANGIOGRAPHY
Anesthesia: Moderate Sedation | Laterality: Bilateral

## 2020-04-29 MED ORDER — ONDANSETRON HCL 4 MG/2ML IJ SOLN: 4.0000 mg | Freq: Four times a day (QID) | INTRAMUSCULAR | Status: DC | PRN

## 2020-04-29 MED ORDER — SODIUM CHLORIDE 0.9 % WEIGHT BASED INFUSION: 1.0000 mL/kg/h | INTRAVENOUS | Status: DC

## 2020-04-29 MED ORDER — LABETALOL HCL 5 MG/ML IV SOLN: 10.0000 mg | INTRAVENOUS | Status: DC | PRN

## 2020-04-29 MED ORDER — SODIUM CHLORIDE 0.9 % IV SOLN: 250.0000 mL | INTRAVENOUS | Status: DC | PRN

## 2020-04-29 MED ORDER — IOHEXOL 300 MG/ML  SOLN
INTRAMUSCULAR | Status: DC | PRN
  Administered 2020-04-29: 120 mL

## 2020-04-29 MED ORDER — LIDOCAINE HCL (PF) 1 % IJ SOLN
INTRAMUSCULAR | Status: AC
  Filled 2020-04-29: qty 30

## 2020-04-29 MED ORDER — LABETALOL HCL 5 MG/ML IV SOLN
INTRAVENOUS | Status: AC
  Filled 2020-04-29: qty 4

## 2020-04-29 MED ORDER — FENTANYL CITRATE (PF) 100 MCG/2ML IJ SOLN
INTRAMUSCULAR | Status: DC | PRN
  Administered 2020-04-29: 50 ug via INTRAVENOUS

## 2020-04-29 MED ORDER — METOPROLOL TARTRATE 25 MG PO TABS: 12.5000 mg | ORAL_TABLET | Freq: Two times a day (BID) | ORAL | 0 refills | Status: DC

## 2020-04-29 MED ORDER — SODIUM CHLORIDE 0.9% FLUSH: 3.0000 mL | Freq: Two times a day (BID) | INTRAVENOUS | Status: DC

## 2020-04-29 MED ORDER — MIDAZOLAM HCL 2 MG/2ML IJ SOLN
INTRAMUSCULAR | Status: DC | PRN
  Administered 2020-04-29: 1 mg via INTRAVENOUS
  Administered 2020-04-29: 2 mg via INTRAVENOUS

## 2020-04-29 MED ORDER — ASPIRIN 81 MG PO CHEW
CHEWABLE_TABLET | ORAL | Status: AC
  Filled 2020-04-29: qty 1

## 2020-04-29 MED ORDER — LABETALOL HCL 5 MG/ML IV SOLN
INTRAVENOUS | Status: DC | PRN
  Administered 2020-04-29: 20 mg via INTRAVENOUS

## 2020-04-29 MED ORDER — MIDAZOLAM HCL 2 MG/2ML IJ SOLN
INTRAMUSCULAR | Status: AC
  Filled 2020-04-29: qty 2

## 2020-04-29 MED ORDER — SODIUM CHLORIDE 0.9% FLUSH: 3.0000 mL | INTRAVENOUS | Status: DC | PRN

## 2020-04-29 MED ORDER — ACETAMINOPHEN 325 MG PO TABS: 650.0000 mg | ORAL_TABLET | ORAL | Status: DC | PRN

## 2020-04-29 MED ORDER — HEPARIN (PORCINE) IN NACL 1000-0.9 UT/500ML-% IV SOLN
INTRAVENOUS | Status: AC
  Filled 2020-04-29: qty 1000

## 2020-04-29 MED ORDER — SODIUM CHLORIDE 0.9% FLUSH
3.0000 mL | INTRAVENOUS | Status: DC | PRN
Start: 2020-04-30 — End: 2020-04-29

## 2020-04-29 MED ORDER — HYDRALAZINE HCL 20 MG/ML IJ SOLN: 10.0000 mg | INTRAMUSCULAR | Status: DC | PRN

## 2020-04-29 MED ORDER — FENTANYL CITRATE (PF) 100 MCG/2ML IJ SOLN
INTRAMUSCULAR | Status: AC
  Filled 2020-04-29: qty 2

## 2020-04-29 MED ORDER — SODIUM CHLORIDE 0.9 % WEIGHT BASED INFUSION: 3.0000 mL/kg/h | INTRAVENOUS | Status: AC

## 2020-04-29 MED ORDER — LIDOCAINE HCL (PF) 1 % IJ SOLN
INTRAMUSCULAR | Status: DC | PRN
  Administered 2020-04-29: 10 mL

## 2020-04-29 MED ORDER — HEPARIN (PORCINE) IN NACL 1000-0.9 UT/500ML-% IV SOLN
INTRAVENOUS | Status: DC | PRN
  Administered 2020-04-29: 500 mL

## 2020-04-29 MED ORDER — ASPIRIN 81 MG PO CHEW
81.0000 mg | CHEWABLE_TABLET | ORAL | Status: AC
  Administered 2020-04-29: 81 mg via ORAL

## 2020-04-29 SURGICAL SUPPLY — 13 items
CATH INFINITI 5FR ANG PIGTAIL (CATHETERS) ×1 IMPLANT
CATH INFINITI 5FR JL4 (CATHETERS) ×1 IMPLANT
CATH INFINITI JR4 5F (CATHETERS) ×1 IMPLANT
CATH SWAN GANZ 7F STRAIGHT (CATHETERS) ×1 IMPLANT
DEVICE CLOSURE MYNXGRIP 6/7F (Vascular Products) ×1 IMPLANT
KIT MANI 3VAL PERCEP (MISCELLANEOUS) ×2 IMPLANT
NDL PERC 18GX7CM (NEEDLE) IMPLANT
NEEDLE PERC 18GX7CM (NEEDLE) ×2 IMPLANT
PACK CARDIAC CATH (CUSTOM PROCEDURE TRAY) ×2 IMPLANT
SHEATH AVANTI 6FR X 11CM (SHEATH) ×1 IMPLANT
SHEATH AVANTI 7FRX11 (SHEATH) ×1 IMPLANT
WIRE EMERALD ST .035X150CM (WIRE) ×1 IMPLANT
WIRE GUIDERIGHT .035X150 (WIRE) ×1 IMPLANT

## 2020-04-29 NOTE — Progress Notes (Signed)
Pt fiance has put pants on pt and his shoes. Advised pt and family member that he will not be ready for discharge until 1515 as I previously stated. The pt sister is their ride. I asked if she need to go and speak to the sister. Pt fiance stated that she just had gall bladder surgery and she is sore and ready to go home and does not feel like walking all the way to the front and all the way back. Advised the fiance that she would not have to walk back and that we would bring him out to her when he was ready for discharge. Pt fiance states "Well, he is ready now." Advised pt fiance again that he is not ready to be discharged per protocol at this time. If he continues to remain stable and can be discharged at 1515 then he will but I will not place the pt in danger of developing an arterial bleed and an admission into the hospital and a possible adverse outcome because she felt he was ready.

## 2020-04-29 NOTE — H&P (Signed)
Update H&P Precardiac cath Indication is aortic stenosis bicuspid aortic valve with syncope Date April 29, 2020  Patient is a 55 year old male history of known coronary disease including myocardial infarction PCI and stent patient had recent syncopal episode underwent cardiac evaluation was found to have preserved left ventricular function but critical aortic stenosis patient underwent TEE after surface echo and appeared to patent bicuspid aortic valve. To the patient was referred for further evaluation in anticipation of potential valve repair or replacement and evaluation of any arthropathy. Patient does complain of some anginal symptoms of shortness of breath reduced energy states to be compliant with his medication still smokes occasionally recently had Covid  Past medical history Coronary artery disease Angina GERD Murmur Hypertension Hyperlipidemia Mild obesity Myocardial infarction Valvular heart disease  Past surgical history cardiac catheterization and PCI and stent  Medications unchanged Patient is on Xanax Aspirin Lovastatin Metoprolol Sublingual nitroglycerin Crestor Prinzide  thiamine Oxycodone  Allergies None  Social history Smoker as well as alcohol consumption  Review of systems unchanged  Physical exam Blood pressure 130/80 pulse of 75 respiratory rate of 12 afebrile O2 sat of 95% Physical exam unchanged stable heart systolic ejection murmur  Impression Preop right and left heart cath evaluation of aortic stenosis possible prehypertension and evaluation of aorta root We will proceed with cardiac cath as an outpatient before referring the patient for possible TAVR versus SAVR based on his aortic root and valvular structures He also needs to be evaluated for coronary artery disease

## 2020-04-29 NOTE — Progress Notes (Signed)
Pt fiance given 2 discount RX cards and the number to the medical assistance program to aid them in paying for his medications.

## 2020-04-29 NOTE — Discharge Instructions (Signed)

## 2020-05-01 ENCOUNTER — Encounter: Payer: Self-pay | Admitting: Internal Medicine

## 2020-05-05 ENCOUNTER — Other Ambulatory Visit: Payer: Self-pay

## 2020-05-05 ENCOUNTER — Encounter: Payer: Self-pay | Admitting: Internal Medicine

## 2020-05-05 ENCOUNTER — Ambulatory Visit (INDEPENDENT_AMBULATORY_CARE_PROVIDER_SITE_OTHER): Payer: BC Managed Care – PPO | Admitting: Internal Medicine

## 2020-05-05 VITALS — BP 144/92 | HR 91 | Ht 66.0 in | Wt 187.9 lb

## 2020-05-05 DIAGNOSIS — Z720 Tobacco use: Secondary | ICD-10-CM

## 2020-05-05 DIAGNOSIS — R55 Syncope and collapse: Secondary | ICD-10-CM | POA: Diagnosis not present

## 2020-05-05 DIAGNOSIS — Q230 Congenital stenosis of aortic valve: Secondary | ICD-10-CM

## 2020-05-05 DIAGNOSIS — I350 Nonrheumatic aortic (valve) stenosis: Secondary | ICD-10-CM

## 2020-05-05 DIAGNOSIS — Q231 Congenital insufficiency of aortic valve: Secondary | ICD-10-CM

## 2020-05-05 DIAGNOSIS — F419 Anxiety disorder, unspecified: Secondary | ICD-10-CM | POA: Diagnosis not present

## 2020-05-05 DIAGNOSIS — E785 Hyperlipidemia, unspecified: Secondary | ICD-10-CM

## 2020-05-05 DIAGNOSIS — I2510 Atherosclerotic heart disease of native coronary artery without angina pectoris: Secondary | ICD-10-CM

## 2020-05-05 MED ORDER — ALPRAZOLAM 0.5 MG PO TABS: 0.5000 mg | ORAL_TABLET | Freq: Two times a day (BID) | ORAL | 1 refills | Status: DC | PRN
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
## 2020-05-07 NOTE — Assessment & Plan Note (Signed)
Patient was advised to have a low-cholesterol Mediterranean diet.

## 2020-05-07 NOTE — Assessment & Plan Note (Signed)
Patient syncope is likely due to aortic valve disease.

## 2020-05-07 NOTE — Addendum Note (Signed)
Addended by: Corky Downs on: 05/07/2020 05:09 PM   Modules accepted: Level of Service

## 2020-05-07 NOTE — Assessment & Plan Note (Signed)
Patient is scheduled to have aortocoronary bypass surgery.

## 2020-05-07 NOTE — Assessment & Plan Note (Signed)
Patient has been scheduled to have aortic valve replacement.

## 2020-05-07 NOTE — Assessment & Plan Note (Signed)
-   Patient experiencing high levels of anxiety.  - Encouraged patient to engage in relaxing activities like yoga, meditation, journaling, going for a walk, or participating in a hobby.  - Encouraged patient to reach out to trusted friends or family members about recent struggles 

## 2020-05-07 NOTE — Assessment & Plan Note (Signed)
Patient was encouraged to stop smoking completely.

## 2020-05-14 DIAGNOSIS — Z7689 Persons encountering health services in other specified circumstances: Secondary | ICD-10-CM | POA: Diagnosis not present

## 2020-05-14 DIAGNOSIS — Q230 Congenital stenosis of aortic valve: Secondary | ICD-10-CM | POA: Diagnosis not present

## 2020-05-14 DIAGNOSIS — Z955 Presence of coronary angioplasty implant and graft: Secondary | ICD-10-CM | POA: Diagnosis not present

## 2020-05-14 DIAGNOSIS — I2510 Atherosclerotic heart disease of native coronary artery without angina pectoris: Secondary | ICD-10-CM | POA: Diagnosis not present

## 2020-06-02 ENCOUNTER — Encounter: Payer: Self-pay | Admitting: Internal Medicine

## 2020-06-02 ENCOUNTER — Other Ambulatory Visit: Payer: Self-pay

## 2020-06-02 ENCOUNTER — Ambulatory Visit (INDEPENDENT_AMBULATORY_CARE_PROVIDER_SITE_OTHER): Payer: BC Managed Care – PPO | Admitting: Internal Medicine

## 2020-06-02 VITALS — BP 151/103 | HR 82 | Ht 66.0 in | Wt 194.2 lb

## 2020-06-02 DIAGNOSIS — F419 Anxiety disorder, unspecified: Secondary | ICD-10-CM | POA: Diagnosis not present

## 2020-06-02 DIAGNOSIS — I10 Essential (primary) hypertension: Secondary | ICD-10-CM | POA: Diagnosis not present

## 2020-06-02 DIAGNOSIS — Q231 Congenital insufficiency of aortic valve: Secondary | ICD-10-CM

## 2020-06-02 DIAGNOSIS — Q230 Congenital stenosis of aortic valve: Secondary | ICD-10-CM

## 2020-06-02 DIAGNOSIS — K219 Gastro-esophageal reflux disease without esophagitis: Secondary | ICD-10-CM

## 2020-06-02 MED ORDER — METOPROLOL TARTRATE 25 MG PO TABS: 12.5000 mg | ORAL_TABLET | Freq: Two times a day (BID) | ORAL | 6 refills | Status: DC

## 2020-06-02 MED ORDER — ALPRAZOLAM 0.5 MG PO TABS: 0.5000 mg | ORAL_TABLET | Freq: Two times a day (BID) | ORAL | 0 refills | Status: DC | PRN

## 2020-06-02 NOTE — Assessment & Plan Note (Signed)
Patient will need open heart surgery for aortic valve replacement

## 2020-06-02 NOTE — Assessment & Plan Note (Signed)
Stable at the present time. 

## 2020-06-02 NOTE — Assessment & Plan Note (Signed)
Patient blood pressure is normal patient denies any chest pain or shortness of breath there is no history of palpitation paroxysmal nocturnal dyspnea patient can walkioo yards without any problem patient was advised to follow low-salt low-cholesterol diet 

## 2020-06-02 NOTE — Assessment & Plan Note (Signed)
-   Patient experiencing high levels of anxiety.  - Encouraged patient to engage in relaxing activities like yoga, meditation, journaling, going for a walk, or participating in a hobby.  - Encouraged patient to reach out to trusted friends or family members about recent struggles 
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
## 2020-06-12 DIAGNOSIS — I4589 Other specified conduction disorders: Secondary | ICD-10-CM | POA: Diagnosis not present

## 2020-06-12 DIAGNOSIS — R55 Syncope and collapse: Secondary | ICD-10-CM | POA: Diagnosis not present

## 2020-06-12 DIAGNOSIS — J988 Other specified respiratory disorders: Secondary | ICD-10-CM | POA: Diagnosis not present

## 2020-06-12 DIAGNOSIS — R942 Abnormal results of pulmonary function studies: Secondary | ICD-10-CM | POA: Diagnosis not present

## 2020-06-12 DIAGNOSIS — Q231 Congenital insufficiency of aortic valve: Secondary | ICD-10-CM | POA: Diagnosis not present

## 2020-06-12 DIAGNOSIS — Z952 Presence of prosthetic heart valve: Secondary | ICD-10-CM | POA: Diagnosis not present

## 2020-06-12 DIAGNOSIS — I712 Thoracic aortic aneurysm, without rupture: Secondary | ICD-10-CM | POA: Diagnosis not present

## 2020-06-12 DIAGNOSIS — I119 Hypertensive heart disease without heart failure: Secondary | ICD-10-CM | POA: Diagnosis not present

## 2020-06-12 DIAGNOSIS — Z01810 Encounter for preprocedural cardiovascular examination: Secondary | ICD-10-CM | POA: Diagnosis not present

## 2020-06-12 DIAGNOSIS — Z955 Presence of coronary angioplasty implant and graft: Secondary | ICD-10-CM | POA: Diagnosis not present

## 2020-06-12 DIAGNOSIS — Z79899 Other long term (current) drug therapy: Secondary | ICD-10-CM | POA: Diagnosis not present

## 2020-06-12 DIAGNOSIS — I2510 Atherosclerotic heart disease of native coronary artery without angina pectoris: Secondary | ICD-10-CM | POA: Diagnosis not present

## 2020-06-12 DIAGNOSIS — F17210 Nicotine dependence, cigarettes, uncomplicated: Secondary | ICD-10-CM | POA: Diagnosis not present

## 2020-06-12 DIAGNOSIS — I10 Essential (primary) hypertension: Secondary | ICD-10-CM | POA: Diagnosis not present

## 2020-06-12 DIAGNOSIS — I719 Aortic aneurysm of unspecified site, without rupture: Secondary | ICD-10-CM | POA: Diagnosis not present

## 2020-06-12 DIAGNOSIS — I350 Nonrheumatic aortic (valve) stenosis: Secondary | ICD-10-CM | POA: Diagnosis not present

## 2020-06-12 DIAGNOSIS — E782 Mixed hyperlipidemia: Secondary | ICD-10-CM | POA: Diagnosis not present

## 2020-06-12 DIAGNOSIS — J449 Chronic obstructive pulmonary disease, unspecified: Secondary | ICD-10-CM | POA: Diagnosis not present

## 2020-06-12 DIAGNOSIS — R9431 Abnormal electrocardiogram [ECG] [EKG]: Secondary | ICD-10-CM | POA: Diagnosis not present

## 2020-06-28 DIAGNOSIS — Q231 Congenital insufficiency of aortic valve: Secondary | ICD-10-CM | POA: Diagnosis not present

## 2020-06-28 DIAGNOSIS — I501 Left ventricular failure: Secondary | ICD-10-CM | POA: Diagnosis not present

## 2020-06-28 DIAGNOSIS — I10 Essential (primary) hypertension: Secondary | ICD-10-CM | POA: Diagnosis not present

## 2020-06-28 DIAGNOSIS — Z95828 Presence of other vascular implants and grafts: Secondary | ICD-10-CM | POA: Diagnosis not present

## 2020-06-28 DIAGNOSIS — I428 Other cardiomyopathies: Secondary | ICD-10-CM | POA: Diagnosis not present

## 2020-06-28 DIAGNOSIS — R0689 Other abnormalities of breathing: Secondary | ICD-10-CM | POA: Diagnosis not present

## 2020-06-28 DIAGNOSIS — K219 Gastro-esophageal reflux disease without esophagitis: Secondary | ICD-10-CM | POA: Diagnosis not present

## 2020-06-28 DIAGNOSIS — F419 Anxiety disorder, unspecified: Secondary | ICD-10-CM | POA: Diagnosis not present

## 2020-06-28 DIAGNOSIS — I252 Old myocardial infarction: Secondary | ICD-10-CM | POA: Diagnosis not present

## 2020-06-28 DIAGNOSIS — I442 Atrioventricular block, complete: Secondary | ICD-10-CM | POA: Diagnosis not present

## 2020-06-28 DIAGNOSIS — Z951 Presence of aortocoronary bypass graft: Secondary | ICD-10-CM | POA: Diagnosis not present

## 2020-06-28 DIAGNOSIS — I712 Thoracic aortic aneurysm, without rupture: Secondary | ICD-10-CM | POA: Diagnosis not present

## 2020-06-28 DIAGNOSIS — Z9889 Other specified postprocedural states: Secondary | ICD-10-CM | POA: Diagnosis not present

## 2020-06-28 DIAGNOSIS — Z952 Presence of prosthetic heart valve: Secondary | ICD-10-CM | POA: Diagnosis not present

## 2020-06-28 DIAGNOSIS — I719 Aortic aneurysm of unspecified site, without rupture: Secondary | ICD-10-CM | POA: Diagnosis not present

## 2020-06-28 DIAGNOSIS — D72829 Elevated white blood cell count, unspecified: Secondary | ICD-10-CM | POA: Diagnosis not present

## 2020-06-28 DIAGNOSIS — Z45018 Encounter for adjustment and management of other part of cardiac pacemaker: Secondary | ICD-10-CM | POA: Diagnosis not present

## 2020-06-28 DIAGNOSIS — G8912 Acute post-thoracotomy pain: Secondary | ICD-10-CM | POA: Diagnosis not present

## 2020-06-28 DIAGNOSIS — I517 Cardiomegaly: Secondary | ICD-10-CM | POA: Diagnosis not present

## 2020-06-28 DIAGNOSIS — Q230 Congenital stenosis of aortic valve: Secondary | ICD-10-CM | POA: Diagnosis not present

## 2020-06-28 DIAGNOSIS — R0600 Dyspnea, unspecified: Secondary | ICD-10-CM | POA: Diagnosis not present

## 2020-06-28 DIAGNOSIS — Z4682 Encounter for fitting and adjustment of non-vascular catheter: Secondary | ICD-10-CM | POA: Diagnosis not present

## 2020-06-28 DIAGNOSIS — I2510 Atherosclerotic heart disease of native coronary artery without angina pectoris: Secondary | ICD-10-CM | POA: Diagnosis not present

## 2020-06-28 DIAGNOSIS — Z20822 Contact with and (suspected) exposure to covid-19: Secondary | ICD-10-CM | POA: Diagnosis not present

## 2020-06-28 DIAGNOSIS — G8918 Other acute postprocedural pain: Secondary | ICD-10-CM | POA: Diagnosis not present

## 2020-06-28 DIAGNOSIS — G4733 Obstructive sleep apnea (adult) (pediatric): Secondary | ICD-10-CM | POA: Diagnosis not present

## 2020-06-28 DIAGNOSIS — D649 Anemia, unspecified: Secondary | ICD-10-CM | POA: Diagnosis not present

## 2020-06-28 DIAGNOSIS — J90 Pleural effusion, not elsewhere classified: Secondary | ICD-10-CM | POA: Diagnosis not present

## 2020-06-28 DIAGNOSIS — F17210 Nicotine dependence, cigarettes, uncomplicated: Secondary | ICD-10-CM | POA: Diagnosis not present

## 2020-06-28 DIAGNOSIS — J811 Chronic pulmonary edema: Secondary | ICD-10-CM | POA: Diagnosis not present

## 2020-06-28 DIAGNOSIS — Z452 Encounter for adjustment and management of vascular access device: Secondary | ICD-10-CM | POA: Diagnosis not present

## 2020-06-28 DIAGNOSIS — R5381 Other malaise: Secondary | ICD-10-CM | POA: Diagnosis not present

## 2020-06-28 DIAGNOSIS — D696 Thrombocytopenia, unspecified: Secondary | ICD-10-CM | POA: Diagnosis not present

## 2020-06-28 DIAGNOSIS — R918 Other nonspecific abnormal finding of lung field: Secondary | ICD-10-CM | POA: Diagnosis not present

## 2020-06-28 DIAGNOSIS — I9789 Other postprocedural complications and disorders of the circulatory system, not elsewhere classified: Secondary | ICD-10-CM | POA: Diagnosis not present

## 2020-06-28 DIAGNOSIS — J9811 Atelectasis: Secondary | ICD-10-CM | POA: Diagnosis not present

## 2020-06-28 DIAGNOSIS — E785 Hyperlipidemia, unspecified: Secondary | ICD-10-CM | POA: Diagnosis not present

## 2020-06-28 DIAGNOSIS — I350 Nonrheumatic aortic (valve) stenosis: Secondary | ICD-10-CM | POA: Diagnosis not present

## 2020-06-30 ENCOUNTER — Ambulatory Visit: Payer: BC Managed Care – PPO | Admitting: Internal Medicine

## 2020-07-20 DIAGNOSIS — Z952 Presence of prosthetic heart valve: Secondary | ICD-10-CM | POA: Diagnosis not present

## 2020-07-20 DIAGNOSIS — Z45018 Encounter for adjustment and management of other part of cardiac pacemaker: Secondary | ICD-10-CM | POA: Diagnosis not present

## 2020-07-20 DIAGNOSIS — J9811 Atelectasis: Secondary | ICD-10-CM | POA: Diagnosis not present

## 2020-07-20 DIAGNOSIS — Z951 Presence of aortocoronary bypass graft: Secondary | ICD-10-CM | POA: Diagnosis not present

## 2020-07-20 DIAGNOSIS — Z95828 Presence of other vascular implants and grafts: Secondary | ICD-10-CM | POA: Diagnosis not present

## 2020-07-21 ENCOUNTER — Encounter: Payer: Self-pay | Admitting: Internal Medicine

## 2020-07-21 ENCOUNTER — Ambulatory Visit (INDEPENDENT_AMBULATORY_CARE_PROVIDER_SITE_OTHER): Payer: BC Managed Care – PPO | Admitting: Internal Medicine

## 2020-07-21 VITALS — BP 132/92 | HR 109 | Ht 66.0 in | Wt 184.3 lb

## 2020-07-21 DIAGNOSIS — Q230 Congenital stenosis of aortic valve: Secondary | ICD-10-CM

## 2020-07-21 DIAGNOSIS — I350 Nonrheumatic aortic (valve) stenosis: Secondary | ICD-10-CM

## 2020-07-21 DIAGNOSIS — I2510 Atherosclerotic heart disease of native coronary artery without angina pectoris: Secondary | ICD-10-CM | POA: Diagnosis not present

## 2020-07-21 DIAGNOSIS — Z720 Tobacco use: Secondary | ICD-10-CM

## 2020-07-21 DIAGNOSIS — K219 Gastro-esophageal reflux disease without esophagitis: Secondary | ICD-10-CM | POA: Diagnosis not present

## 2020-07-21 DIAGNOSIS — F10920 Alcohol use, unspecified with intoxication, uncomplicated: Secondary | ICD-10-CM

## 2020-07-21 DIAGNOSIS — I10 Essential (primary) hypertension: Secondary | ICD-10-CM

## 2020-07-21 DIAGNOSIS — Q231 Congenital insufficiency of aortic valve: Secondary | ICD-10-CM

## 2020-07-22 ENCOUNTER — Inpatient Hospital Stay: Payer: BC Managed Care – PPO | Admitting: Internal Medicine

## 2020-07-29 ENCOUNTER — Other Ambulatory Visit: Payer: Self-pay | Admitting: Internal Medicine

## 2020-08-03 ENCOUNTER — Other Ambulatory Visit: Payer: Self-pay | Admitting: Internal Medicine

## 2020-08-06 ENCOUNTER — Encounter: Payer: Self-pay | Admitting: Internal Medicine

## 2020-08-06 DIAGNOSIS — Q231 Congenital insufficiency of aortic valve: Secondary | ICD-10-CM | POA: Diagnosis not present

## 2020-08-06 DIAGNOSIS — I209 Angina pectoris, unspecified: Secondary | ICD-10-CM | POA: Diagnosis not present

## 2020-08-06 DIAGNOSIS — I10 Essential (primary) hypertension: Secondary | ICD-10-CM | POA: Diagnosis not present

## 2020-08-06 DIAGNOSIS — Z952 Presence of prosthetic heart valve: Secondary | ICD-10-CM | POA: Diagnosis not present

## 2020-08-06 NOTE — Assessment & Plan Note (Signed)
Patient has stopped drinking

## 2020-08-06 NOTE — Assessment & Plan Note (Signed)
Patient is a internal mammary bypass surgery and he is doing well

## 2020-08-06 NOTE — Assessment & Plan Note (Signed)
Aortic valve has been replaced, cow valve prosthesis was used according to the patient

## 2020-08-06 NOTE — Assessment & Plan Note (Signed)
Patient blood pressure is normal patient denies any chest pain or shortness of breath there is no history of palpitation or paroxysmal nocturnal dyspnea   patient was advised to follow low-salt low-cholesterol diet    ideally I want to keep systolic blood pressure below 130 mmHg, patient was asked to check blood pressure one times a week and give me a report on that.  Patient will be follow-up in 3 months  or earlier as needed, patient will call me back for any change in the cardiovascular symptoms    

## 2020-08-06 NOTE — Assessment & Plan Note (Signed)
Patient educated extensively on acid reflux lifestyle modification, including buying a bed wedge, not eating 3 hrs before bedtime, diet modifications, and handout given for the same.  
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
## 2020-08-06 NOTE — Assessment & Plan Note (Signed)
-   I instructed the patient to stop smoking and provided them with smoking cessation materials.  - I informed the patient that smoking puts them at increased risk for cancer, COPD, hypertension, and more.  - Informed the patient to seek help if they begin to have trouble breathing, develop chest pain, start to cough up blood, feel faint, or pass out.  

## 2020-08-26 ENCOUNTER — Other Ambulatory Visit: Payer: Self-pay

## 2020-08-26 MED ORDER — ALBUTEROL SULFATE (2.5 MG/3ML) 0.083% IN NEBU: 2.5000 mg | INHALATION_SOLUTION | Freq: Four times a day (QID) | RESPIRATORY_TRACT | 1 refills | Status: DC | PRN

## 2020-09-22 ENCOUNTER — Other Ambulatory Visit: Payer: Self-pay | Admitting: *Deleted

## 2020-09-22 MED ORDER — LEVOFLOXACIN 250 MG PO TABS: 250.0000 mg | ORAL_TABLET | Freq: Every day | ORAL | 0 refills | Status: DC

## 2020-10-01 ENCOUNTER — Ambulatory Visit (INDEPENDENT_AMBULATORY_CARE_PROVIDER_SITE_OTHER): Payer: BC Managed Care – PPO | Admitting: Family Medicine

## 2020-10-01 ENCOUNTER — Encounter: Payer: Self-pay | Admitting: Family Medicine

## 2020-10-01 ENCOUNTER — Other Ambulatory Visit: Payer: Self-pay

## 2020-10-01 VITALS — BP 161/91 | HR 82 | Ht 66.0 in | Wt 189.2 lb

## 2020-10-01 DIAGNOSIS — F419 Anxiety disorder, unspecified: Secondary | ICD-10-CM

## 2020-10-01 DIAGNOSIS — L03317 Cellulitis of buttock: Secondary | ICD-10-CM | POA: Diagnosis not present

## 2020-10-01 DIAGNOSIS — L0231 Cutaneous abscess of buttock: Secondary | ICD-10-CM

## 2020-10-01 MED ORDER — SULFAMETHOXAZOLE-TRIMETHOPRIM 800-160 MG PO TABS: 1.0000 | ORAL_TABLET | Freq: Two times a day (BID) | ORAL | 0 refills | Status: DC

## 2020-10-01 MED ORDER — ALPRAZOLAM 1 MG PO TABS: 1.0000 mg | ORAL_TABLET | Freq: Two times a day (BID) | ORAL | 0 refills | Status: DC | PRN

## 2020-10-01 NOTE — Assessment & Plan Note (Signed)
Increasing Xanax from   to 1 mg as his anxiety has worsened with recent cardiac conditions.
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
## 2020-10-01 NOTE — Assessment & Plan Note (Addendum)
Patient with small quarter size abscess left buttocks x 2 weeks, it has drained but it is still painful.   Plan- Change abx therapy fu as needed.

## 2020-10-26 ENCOUNTER — Other Ambulatory Visit: Payer: Self-pay

## 2020-10-26 MED ORDER — ALPRAZOLAM 0.5 MG PO TABS: 0.5000 mg | ORAL_TABLET | Freq: Two times a day (BID) | ORAL | 0 refills | Status: DC | PRN

## 2020-10-30 ENCOUNTER — Other Ambulatory Visit: Payer: Self-pay | Admitting: Internal Medicine

## 2020-11-11 ENCOUNTER — Other Ambulatory Visit: Payer: Self-pay

## 2020-11-11 ENCOUNTER — Encounter: Payer: Self-pay | Admitting: Internal Medicine

## 2020-11-11 ENCOUNTER — Ambulatory Visit (INDEPENDENT_AMBULATORY_CARE_PROVIDER_SITE_OTHER): Payer: BC Managed Care – PPO | Admitting: Internal Medicine

## 2020-11-11 VITALS — BP 169/94 | HR 86 | Ht 66.0 in | Wt 190.1 lb

## 2020-11-11 DIAGNOSIS — Z720 Tobacco use: Secondary | ICD-10-CM | POA: Diagnosis not present

## 2020-11-11 DIAGNOSIS — F419 Anxiety disorder, unspecified: Secondary | ICD-10-CM

## 2020-11-11 DIAGNOSIS — I10 Essential (primary) hypertension: Secondary | ICD-10-CM

## 2020-11-11 MED ORDER — ALPRAZOLAM 0.5 MG PO TABS: 0.5000 mg | ORAL_TABLET | Freq: Two times a day (BID) | ORAL | 0 refills | Status: DC | PRN

## 2020-11-12 NOTE — Assessment & Plan Note (Signed)
instruction/counseling given:  counseled patient on the dangers of tobacco use, advised patient to stop smoking, and reviewed strategies to maximize success It is very important that pt quit smoking. There are various alternatives available to help with this difficult task, but first and foremost, pt must make a firm commitment and decision to quit. The nature of nicotine addiction is discussed. The usefulness of behavioral therapy is discussed and suggested.  The correct use, cost and side effects of nicotine replacement therapy such as gum or patches is discussed. Bupropion and its cost (sometimes not covered fully by insurance) and side effects are reviewed. The quit rates are discussed. I recommend pt not allow potential costs of treatment to deter ptfrom using nicotine replacement therapy or bupropion, as the long term economic and health benefits are obvious.
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
## 2020-11-12 NOTE — Assessment & Plan Note (Signed)
-   Patient experiencing high levels of anxiety.  - Encouraged patient to engage in relaxing activities like yoga, meditation, journaling, going for a walk, or participating in a hobby.  - Encouraged patient to reach out to trusted friends or family members about recent struggles, Patient was advised to read A book, how to stop worrying and start living, it is good book to read to control  the stress  

## 2020-11-12 NOTE — Assessment & Plan Note (Signed)

## 2020-12-09 ENCOUNTER — Encounter: Payer: Self-pay | Admitting: Internal Medicine

## 2020-12-09 ENCOUNTER — Other Ambulatory Visit: Payer: Self-pay

## 2020-12-09 ENCOUNTER — Ambulatory Visit (INDEPENDENT_AMBULATORY_CARE_PROVIDER_SITE_OTHER): Payer: BC Managed Care – PPO | Admitting: Internal Medicine

## 2020-12-09 VITALS — BP 162/98 | HR 88 | Ht 66.0 in | Wt 190.8 lb

## 2020-12-09 DIAGNOSIS — Z720 Tobacco use: Secondary | ICD-10-CM

## 2020-12-09 DIAGNOSIS — K219 Gastro-esophageal reflux disease without esophagitis: Secondary | ICD-10-CM | POA: Diagnosis not present

## 2020-12-09 DIAGNOSIS — I10 Essential (primary) hypertension: Secondary | ICD-10-CM

## 2020-12-09 DIAGNOSIS — E785 Hyperlipidemia, unspecified: Secondary | ICD-10-CM

## 2020-12-09 DIAGNOSIS — Z760 Encounter for issue of repeat prescription: Secondary | ICD-10-CM | POA: Diagnosis not present

## 2020-12-09 DIAGNOSIS — I350 Nonrheumatic aortic (valve) stenosis: Secondary | ICD-10-CM

## 2020-12-09 DIAGNOSIS — F419 Anxiety disorder, unspecified: Secondary | ICD-10-CM | POA: Diagnosis not present

## 2020-12-09 LAB — POCT URINE DRUG SCREEN
Methylenedioxyamphetamine: NOT DETECTED
POC Amphetamine UR: NOT DETECTED
POC BENZODIAZEPINES UR: POSITIVE — AB
POC Barbiturate UR: NOT DETECTED
POC Cocaine UR: NOT DETECTED
POC DRUG SCREEN OXIDANTS URINE: NORMAL
POC Ecstasy UR: NOT DETECTED
POC Marijuana UR: NOT DETECTED
POC Methadone UR: NOT DETECTED
POC Methamphetamine UR: NOT DETECTED
POC Opiate Ur: NOT DETECTED
POC Oxycodone UR: NOT DETECTED
POC PHENCYCLIDINE UR: NOT DETECTED
POC TRICYCLICS UR: NOT DETECTED

## 2020-12-09 MED ORDER — ALPRAZOLAM 0.5 MG PO TABS: 0.5000 mg | ORAL_TABLET | Freq: Two times a day (BID) | ORAL | 0 refills | Status: DC | PRN

## 2020-12-09 NOTE — Assessment & Plan Note (Signed)

## 2020-12-09 NOTE — Assessment & Plan Note (Signed)
Patient quit smoking 

## 2020-12-09 NOTE — Assessment & Plan Note (Signed)
-   The patient's GERD is stable on medication.  - Instructed the patient to avoid eating spicy and acidic foods, as well as foods high in fat. - Instructed the patient to avoid eating large meals or meals 2-3 hours prior to sleeping. 

## 2020-12-09 NOTE — Assessment & Plan Note (Signed)
-   Patient experiencing high levels of anxiety.  - Encouraged patient to engage in relaxing activities like yoga, meditation, journaling, going for a walk, or participating in a hobby.  - Encouraged patient to reach out to trusted friends or family members about recent struggles, Patient was advised to read A book, how to stop worrying and start living, it is good book to read to control  the stress  

## 2020-12-09 NOTE — Assessment & Plan Note (Signed)
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
## 2020-12-09 NOTE — Assessment & Plan Note (Signed)
Hypercholesterolemia  I advised the patient to follow Mediterranean diet This diet is rich in fruits vegetables and whole grain, and This diet is also rich in fish and lean meat Patient should also eat a handful of almonds or walnuts daily Recent heart study indicated that average follow-up on this kind of diet reduces the cardiovascular mortality by 50 to 70%== 

## 2021-01-06 ENCOUNTER — Other Ambulatory Visit: Payer: Self-pay

## 2021-01-06 ENCOUNTER — Encounter: Payer: Self-pay | Admitting: Internal Medicine

## 2021-01-06 ENCOUNTER — Ambulatory Visit (INDEPENDENT_AMBULATORY_CARE_PROVIDER_SITE_OTHER): Payer: BC Managed Care – PPO | Admitting: Internal Medicine

## 2021-01-06 VITALS — BP 173/92 | HR 73 | Ht 66.0 in | Wt 186.5 lb

## 2021-01-06 DIAGNOSIS — K219 Gastro-esophageal reflux disease without esophagitis: Secondary | ICD-10-CM | POA: Diagnosis not present

## 2021-01-06 DIAGNOSIS — F419 Anxiety disorder, unspecified: Secondary | ICD-10-CM

## 2021-01-06 DIAGNOSIS — E785 Hyperlipidemia, unspecified: Secondary | ICD-10-CM

## 2021-01-06 DIAGNOSIS — Z720 Tobacco use: Secondary | ICD-10-CM

## 2021-01-06 DIAGNOSIS — Q230 Congenital stenosis of aortic valve: Secondary | ICD-10-CM

## 2021-01-06 DIAGNOSIS — Q231 Congenital insufficiency of aortic valve: Secondary | ICD-10-CM

## 2021-01-06 DIAGNOSIS — I10 Essential (primary) hypertension: Secondary | ICD-10-CM | POA: Diagnosis not present

## 2021-01-06 MED ORDER — ALPRAZOLAM 0.5 MG PO TABS: 0.5000 mg | ORAL_TABLET | Freq: Two times a day (BID) | ORAL | 0 refills | Status: DC | PRN
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
## 2021-01-06 NOTE — Assessment & Plan Note (Signed)
The following hypertensive lifestyle modification were recommended and discussed:  1. Limiting alcohol intake to less than 1 oz/day of ethanol:(24 oz of beer or 8 oz of wine or 2 oz of 100-proof whiskey).  3. Importance of regular aerobic exercise and losing weight. 4. Reduce dietary saturated fat and cholesterol intake for overall cardiovascular health. 5. Maintaining adequate dietary potassium, calcium, and magnesium intake. 6. Regular monitoring of the blood pressure. 7. Reduce sodium intake to less than 100 mmol/day (less than 2.3 gm of sodium or less than 6 gm of sodium choride)  

## 2021-01-06 NOTE — Assessment & Plan Note (Signed)
Patient has an aortic valve replacement is working well

## 2021-01-06 NOTE — Assessment & Plan Note (Signed)
Hypercholesterolemia  I advised the patient to follow Mediterranean diet This diet is rich in fruits vegetables and whole grain, and This diet is also rich in fish and lean meat Patient should also eat a handful of almonds or walnuts daily Recent heart study indicated that average follow-up on this kind of diet reduces the cardiovascular mortality by 50 to 70%== 

## 2021-01-06 NOTE — Assessment & Plan Note (Signed)
-   I instructed the patient to stop smoking and provided them with smoking cessation materials.  - I informed the patient that smoking puts them at increased risk for cancer, COPD, hypertension, and more.  - Informed the patient to seek help if they begin to have trouble breathing, develop chest pain, start to cough up blood, feel faint, or pass out.  

## 2021-01-06 NOTE — Assessment & Plan Note (Signed)
-   The patient's GERD is stable on medication.  - Instructed the patient to avoid eating spicy and acidic foods, as well as foods high in fat. - Instructed the patient to avoid eating large meals or meals 2-3 hours prior to sleeping. 

## 2021-01-06 NOTE — Assessment & Plan Note (Signed)
   Keeping a stress/anxiety diary. This can help you learn what triggers your reaction and then learn ways to manage your response.  Thinking about how you react to certain situations. You may not be able to control everything, but you can control your response.  Making time for activities that help you relax and not feeling guilty about spending your time in this way.  Visual imagery and yoga can help you stay calm and relax. 

## 2021-02-01 ENCOUNTER — Encounter: Payer: Self-pay | Admitting: Internal Medicine

## 2021-02-01 ENCOUNTER — Ambulatory Visit: Payer: BC Managed Care – PPO | Admitting: Internal Medicine

## 2021-02-01 ENCOUNTER — Other Ambulatory Visit: Payer: Self-pay

## 2021-02-01 VITALS — BP 160/90 | HR 85 | Ht 66.0 in | Wt 195.5 lb

## 2021-02-01 DIAGNOSIS — Q231 Congenital insufficiency of aortic valve: Secondary | ICD-10-CM

## 2021-02-01 DIAGNOSIS — I10 Essential (primary) hypertension: Secondary | ICD-10-CM | POA: Diagnosis not present

## 2021-02-01 DIAGNOSIS — F419 Anxiety disorder, unspecified: Secondary | ICD-10-CM | POA: Diagnosis not present

## 2021-02-01 DIAGNOSIS — Q230 Congenital stenosis of aortic valve: Secondary | ICD-10-CM

## 2021-02-01 DIAGNOSIS — Z720 Tobacco use: Secondary | ICD-10-CM

## 2021-02-01 DIAGNOSIS — K219 Gastro-esophageal reflux disease without esophagitis: Secondary | ICD-10-CM

## 2021-02-01 DIAGNOSIS — E782 Mixed hyperlipidemia: Secondary | ICD-10-CM

## 2021-02-01 MED ORDER — FUROSEMIDE 40 MG PO TABS: 40.0000 mg | ORAL_TABLET | Freq: Every day | ORAL | 3 refills | Status: DC

## 2021-02-01 MED ORDER — METOPROLOL TARTRATE 25 MG PO TABS: 12.5000 mg | ORAL_TABLET | Freq: Two times a day (BID) | ORAL | 3 refills | Status: DC

## 2021-02-01 MED ORDER — ALPRAZOLAM 0.5 MG PO TABS: 0.5000 mg | ORAL_TABLET | Freq: Two times a day (BID) | ORAL | 0 refills | Status: DC | PRN

## 2021-02-01 MED ORDER — POTASSIUM CHLORIDE CRYS ER 20 MEQ PO TBCR: EXTENDED_RELEASE_TABLET | ORAL | 3 refills | Status: DC

## 2021-02-01 MED ORDER — LOSARTAN POTASSIUM-HCTZ 50-12.5 MG PO TABS
1.0000 | ORAL_TABLET | Freq: Every day | ORAL | 2 refills | Status: DC
Start: 2021-02-01 — End: 2021-11-11

## 2021-02-01 MED ORDER — ROSUVASTATIN CALCIUM 20 MG PO TABS: 20.0000 mg | ORAL_TABLET | Freq: Every day | ORAL | 3 refills | Status: DC

## 2021-02-01 NOTE — Assessment & Plan Note (Signed)
Blood pressure is elevated so I will start him on losartan 50 mg with HCTZ 12.5.  He was advised to reduce his Lasix to 20 mg daily.  Stop taking the potassium pill
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
## 2021-02-01 NOTE — Assessment & Plan Note (Signed)
Hypercholesterolemia  I advised the patient to follow Mediterranean diet This diet is rich in fruits vegetables and whole grain, and This diet is also rich in fish and lean meat Patient should also eat a handful of almonds or walnuts daily Recent heart study indicated that average follow-up on this kind of diet reduces the cardiovascular mortality by 50 to 70%== 

## 2021-02-01 NOTE — Assessment & Plan Note (Signed)
Status post AVR

## 2021-02-01 NOTE — Assessment & Plan Note (Signed)
-   Patient experiencing high levels of anxiety.  - Encouraged patient to engage in relaxing activities like yoga, meditation, journaling, going for a walk, or participating in a hobby.  - Encouraged patient to reach out to trusted friends or family members about recent struggles, Patient was advised to read A book, how to stop worrying and start living, it is good book to read to control  the stress  

## 2021-02-01 NOTE — Assessment & Plan Note (Signed)
-   I instructed the patient to stop smoking and provided them with smoking cessation materials.  - I informed the patient that smoking puts them at increased risk for cancer, COPD, hypertension, and more.  - Informed the patient to seek help if they begin to have trouble breathing, develop chest pain, start to cough up blood, feel faint, or pass out.  

## 2021-02-01 NOTE — Assessment & Plan Note (Signed)
-   The patient's GERD is stable on medication.  - Instructed the patient to avoid eating spicy and acidic foods, as well as foods high in fat. - Instructed the patient to avoid eating large meals or meals 2-3 hours prior to sleeping. 

## 2021-02-15 ENCOUNTER — Encounter: Payer: Self-pay | Admitting: Internal Medicine

## 2021-02-15 ENCOUNTER — Other Ambulatory Visit: Payer: Self-pay

## 2021-02-15 ENCOUNTER — Ambulatory Visit (INDEPENDENT_AMBULATORY_CARE_PROVIDER_SITE_OTHER): Payer: BC Managed Care – PPO | Admitting: Internal Medicine

## 2021-02-15 VITALS — BP 165/100 | HR 88 | Ht 66.0 in | Wt 192.5 lb

## 2021-02-15 DIAGNOSIS — F419 Anxiety disorder, unspecified: Secondary | ICD-10-CM

## 2021-02-15 DIAGNOSIS — I2510 Atherosclerotic heart disease of native coronary artery without angina pectoris: Secondary | ICD-10-CM | POA: Diagnosis not present

## 2021-02-15 DIAGNOSIS — I350 Nonrheumatic aortic (valve) stenosis: Secondary | ICD-10-CM

## 2021-02-15 DIAGNOSIS — I10 Essential (primary) hypertension: Secondary | ICD-10-CM | POA: Diagnosis not present

## 2021-02-15 DIAGNOSIS — K219 Gastro-esophageal reflux disease without esophagitis: Secondary | ICD-10-CM

## 2021-02-15 DIAGNOSIS — F10920 Alcohol use, unspecified with intoxication, uncomplicated: Secondary | ICD-10-CM

## 2021-02-15 MED ORDER — ALPRAZOLAM 0.5 MG PO TABS: 0.5000 mg | ORAL_TABLET | Freq: Two times a day (BID) | ORAL | 0 refills | Status: DC | PRN

## 2021-02-15 MED ORDER — METOPROLOL TARTRATE 25 MG PO TABS: 25.0000 mg | ORAL_TABLET | Freq: Two times a day (BID) | ORAL | 1 refills | Status: DC

## 2021-02-15 NOTE — Assessment & Plan Note (Signed)
Patient educated extensively on acid reflux lifestyle modification, including buying a bed wedge, not eating 3 hrs before bedtime, diet modifications, and handout given for the same.  

## 2021-02-15 NOTE — Addendum Note (Signed)
Addended by: Jobie Quaker on: 02/15/2021 04:48 PM   Modules accepted: Orders

## 2021-02-15 NOTE — Assessment & Plan Note (Signed)
Patient is status post bypass surgery he denies any chest pain or shortness of breath

## 2021-02-15 NOTE — Assessment & Plan Note (Signed)
Patient is status post AVR
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
## 2021-02-15 NOTE — Assessment & Plan Note (Signed)
Patient does not drink anymore. 

## 2021-02-15 NOTE — Assessment & Plan Note (Signed)
   Keeping a stress/anxiety diary. This can help you learn what triggers your reaction and then learn ways to manage your response.  Thinking about how you react to certain situations. You may not be able to control everything, but you can control your response.  Making time for activities that help you relax and not feeling guilty about spending your time in this way.  Visual imagery and yoga can help you stay calm and relax. 

## 2021-02-15 NOTE — Assessment & Plan Note (Signed)

## 2021-03-17 ENCOUNTER — Ambulatory Visit: Payer: BC Managed Care – PPO | Admitting: Internal Medicine

## 2021-04-05 ENCOUNTER — Ambulatory Visit: Payer: 59 | Admitting: Internal Medicine

## 2021-04-05 ENCOUNTER — Other Ambulatory Visit: Payer: Self-pay

## 2021-04-05 ENCOUNTER — Encounter: Payer: Self-pay | Admitting: Internal Medicine

## 2021-04-05 VITALS — BP 166/94 | HR 65 | Ht 66.0 in | Wt 201.5 lb

## 2021-04-05 DIAGNOSIS — K219 Gastro-esophageal reflux disease without esophagitis: Secondary | ICD-10-CM

## 2021-04-05 DIAGNOSIS — F419 Anxiety disorder, unspecified: Secondary | ICD-10-CM

## 2021-04-05 DIAGNOSIS — I10 Essential (primary) hypertension: Secondary | ICD-10-CM | POA: Diagnosis not present

## 2021-04-05 DIAGNOSIS — Z720 Tobacco use: Secondary | ICD-10-CM

## 2021-04-05 DIAGNOSIS — I350 Nonrheumatic aortic (valve) stenosis: Secondary | ICD-10-CM

## 2021-04-05 DIAGNOSIS — E782 Mixed hyperlipidemia: Secondary | ICD-10-CM

## 2021-04-05 MED ORDER — ALPRAZOLAM 0.5 MG PO TABS: 0.5000 mg | ORAL_TABLET | Freq: Two times a day (BID) | ORAL | 0 refills | Status: DC | PRN

## 2021-04-05 NOTE — Assessment & Plan Note (Signed)

## 2021-04-05 NOTE — Assessment & Plan Note (Signed)

## 2021-04-05 NOTE — Assessment & Plan Note (Signed)
S/p AVR
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
## 2021-04-05 NOTE — Assessment & Plan Note (Signed)
-   Patient experiencing high levels of anxiety.  - Encouraged patient to engage in relaxing activities like yoga, meditation, journaling, going for a walk, or participating in a hobby.  - Encouraged patient to reach out to trusted friends or family members about recent struggles, Patient was advised to read A book, how to stop worrying and start living, it is good book to read to control  the stress  

## 2021-04-05 NOTE — Assessment & Plan Note (Signed)

## 2021-04-05 NOTE — Assessment & Plan Note (Signed)
Hypercholesterolemia  I advised the patient to follow Mediterranean diet This diet is rich in fruits vegetables and whole grain, and This diet is also rich in fish and lean meat Patient should also eat a handful of almonds or walnuts daily Recent heart study indicated that average follow-up on this kind of diet reduces the cardiovascular mortality by 50 to 70%== 

## 2021-04-20 ENCOUNTER — Other Ambulatory Visit: Payer: Self-pay | Admitting: *Deleted

## 2021-04-20 MED ORDER — NEBULIZER DEVI: 1.0000 | Freq: Four times a day (QID) | 0 refills | Status: AC

## 2021-05-06 ENCOUNTER — Encounter: Payer: Self-pay | Admitting: Nurse Practitioner

## 2021-05-06 ENCOUNTER — Ambulatory Visit (INDEPENDENT_AMBULATORY_CARE_PROVIDER_SITE_OTHER): Payer: 59 | Admitting: Nurse Practitioner

## 2021-05-06 ENCOUNTER — Other Ambulatory Visit: Payer: Self-pay

## 2021-05-06 VITALS — BP 162/89 | HR 88 | Ht 66.0 in | Wt 211.5 lb

## 2021-05-06 DIAGNOSIS — F419 Anxiety disorder, unspecified: Secondary | ICD-10-CM | POA: Diagnosis not present

## 2021-05-06 DIAGNOSIS — E669 Obesity, unspecified: Secondary | ICD-10-CM

## 2021-05-06 DIAGNOSIS — I350 Nonrheumatic aortic (valve) stenosis: Secondary | ICD-10-CM

## 2021-05-06 DIAGNOSIS — R062 Wheezing: Secondary | ICD-10-CM | POA: Diagnosis not present

## 2021-05-06 DIAGNOSIS — I10 Essential (primary) hypertension: Secondary | ICD-10-CM

## 2021-05-06 MED ORDER — PREDNISONE 10 MG PO TABS: 10.0000 mg | ORAL_TABLET | Freq: Every day | ORAL | 0 refills | Status: DC

## 2021-05-06 MED ORDER — METOPROLOL TARTRATE 25 MG PO TABS: 25.0000 mg | ORAL_TABLET | Freq: Two times a day (BID) | ORAL | 1 refills | Status: DC

## 2021-05-06 MED ORDER — FUROSEMIDE 40 MG PO TABS: 40.0000 mg | ORAL_TABLET | Freq: Every day | ORAL | 3 refills | Status: DC

## 2021-05-06 MED ORDER — ALPRAZOLAM 0.5 MG PO TABS: 0.5000 mg | ORAL_TABLET | Freq: Two times a day (BID) | ORAL | 0 refills | Status: DC | PRN

## 2021-05-06 MED ORDER — ALBUTEROL SULFATE (2.5 MG/3ML) 0.083% IN NEBU: 2.5000 mg | INHALATION_SOLUTION | Freq: Four times a day (QID) | RESPIRATORY_TRACT | 1 refills | Status: AC | PRN

## 2021-05-06 MED ORDER — ROSUVASTATIN CALCIUM 20 MG PO TABS: 20.0000 mg | ORAL_TABLET | Freq: Every day | ORAL | 4 refills | Status: DC

## 2021-05-06 MED ORDER — ALBUTEROL SULFATE HFA 108 (90 BASE) MCG/ACT IN AERS: 2.0000 | INHALATION_SPRAY | Freq: Four times a day (QID) | RESPIRATORY_TRACT | 6 refills | Status: AC | PRN

## 2021-05-06 NOTE — Assessment & Plan Note (Signed)
Doing better. Advise pt to practice meditation and breathing excerises.  Continue the current medication Xanax. Refilled the medication.

## 2021-05-06 NOTE — Assessment & Plan Note (Addendum)
Pt was out of his medication. Gave patient  the nebulizer treatment in the office. Wheezing improved after the treatment. Ordered albuterol inhaler and  predisone Refilled the medication. Advised pt to go the urgent care/ER if symptoms does not resolve or trouble breathing.

## 2021-05-06 NOTE — Assessment & Plan Note (Signed)
Advise pt to lose weight. Advised pt to monitor diet. Advised pt to eat variety of food including fruits, vegetables, whole grains, complex carbohydrates and proteins.  Advised pt to follow regular exercise routine.

## 2021-05-06 NOTE — Assessment & Plan Note (Addendum)
Patient BP 162/89  in the office today. Advised pt to follow a low sodium and heart healthy diet. I discussed the patient's medication regimen and verified that they are taking the medications appropriately.  Will continue to monitor. Pt due for labs.
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
## 2021-06-03 ENCOUNTER — Other Ambulatory Visit: Payer: Self-pay

## 2021-06-03 ENCOUNTER — Ambulatory Visit (INDEPENDENT_AMBULATORY_CARE_PROVIDER_SITE_OTHER): Payer: 59 | Admitting: Nurse Practitioner

## 2021-06-03 ENCOUNTER — Encounter: Payer: Self-pay | Admitting: Nurse Practitioner

## 2021-06-03 VITALS — BP 147/84 | HR 80 | Ht 66.0 in | Wt 206.7 lb

## 2021-06-03 DIAGNOSIS — I10 Essential (primary) hypertension: Secondary | ICD-10-CM | POA: Diagnosis not present

## 2021-06-03 DIAGNOSIS — F419 Anxiety disorder, unspecified: Secondary | ICD-10-CM | POA: Diagnosis not present

## 2021-06-03 DIAGNOSIS — R062 Wheezing: Secondary | ICD-10-CM | POA: Diagnosis not present

## 2021-06-03 MED ORDER — ALPRAZOLAM 0.5 MG PO TABS: 0.5000 mg | ORAL_TABLET | Freq: Two times a day (BID) | ORAL | 0 refills | Status: DC | PRN

## 2021-06-03 MED ORDER — BUDESONIDE-FORMOTEROL FUMARATE 80-4.5 MCG/ACT IN AERO: 2.0000 | INHALATION_SPRAY | Freq: Two times a day (BID) | RESPIRATORY_TRACT | 3 refills | Status: AC
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
## 2021-06-03 NOTE — Assessment & Plan Note (Addendum)
Wheezing middle lobe bilaterally. ?Started on Symbicort inhaler. ?Referral sent to pulmonologist Dr. Nickolas Madrid ?Asked patient to make a follow up appointment with the cardiologist. ?  ? ? ?

## 2021-06-03 NOTE — Assessment & Plan Note (Signed)
Stable on medication. ?Refilled alprazolam 0.5 mg ?

## 2021-06-03 NOTE — Assessment & Plan Note (Signed)
BP 147/84  ?Discussed the pt medication regimen and verified that they are taking the medication appropriately.  ?Advise pt to limit the intake of sodium. ? ? ? ?

## 2021-06-04 ENCOUNTER — Emergency Department: Payer: 59

## 2021-06-04 ENCOUNTER — Emergency Department
Admission: EM | Admit: 2021-06-04 | Discharge: 2021-06-05 | Disposition: A | Discharge status: Home / Self Care | Payer: 59 | Attending: Emergency Medicine | Admitting: Emergency Medicine

## 2021-06-04 DIAGNOSIS — W01198A Fall on same level from slipping, tripping and stumbling with subsequent striking against other object, initial encounter: Secondary | ICD-10-CM | POA: Diagnosis not present

## 2021-06-04 DIAGNOSIS — S0181XA Laceration without foreign body of other part of head, initial encounter: Secondary | ICD-10-CM | POA: Insufficient documentation

## 2021-06-04 DIAGNOSIS — S0121XA Laceration without foreign body of nose, initial encounter: Secondary | ICD-10-CM | POA: Insufficient documentation

## 2021-06-04 DIAGNOSIS — S0990XA Unspecified injury of head, initial encounter: Secondary | ICD-10-CM

## 2021-06-04 DIAGNOSIS — S50811A Abrasion of right forearm, initial encounter: Secondary | ICD-10-CM | POA: Diagnosis not present

## 2021-06-04 DIAGNOSIS — I10 Essential (primary) hypertension: Secondary | ICD-10-CM | POA: Diagnosis not present

## 2021-06-04 DIAGNOSIS — S0101XA Laceration without foreign body of scalp, initial encounter: Secondary | ICD-10-CM | POA: Diagnosis not present

## 2021-06-04 DIAGNOSIS — W19XXXA Unspecified fall, initial encounter: Secondary | ICD-10-CM

## 2021-06-04 LAB — CBC WITH DIFFERENTIAL/PLATELET
Abs Immature Granulocytes: 0.32 10*3/uL — ABNORMAL HIGH (ref 0.00–0.07)
Basophils Absolute: 0 10*3/uL (ref 0.0–0.1)
Basophils Relative: 0 %
Eosinophils Absolute: 0.3 10*3/uL (ref 0.0–0.5)
Eosinophils Relative: 3 %
HCT: 39.3 % (ref 39.0–52.0)
Hemoglobin: 13.2 g/dL (ref 13.0–17.0)
Immature Granulocytes: 4 %
Lymphocytes Relative: 26 %
Lymphs Abs: 2.4 10*3/uL (ref 0.7–4.0)
MCH: 31.6 pg (ref 26.0–34.0)
MCHC: 33.6 g/dL (ref 30.0–36.0)
MCV: 94 fL (ref 80.0–100.0)
Monocytes Absolute: 0.8 10*3/uL (ref 0.1–1.0)
Monocytes Relative: 9 %
Neutro Abs: 5.3 10*3/uL (ref 1.7–7.7)
Neutrophils Relative %: 58 %
Platelets: 157 10*3/uL (ref 150–400)
RBC: 4.18 MIL/uL — ABNORMAL LOW (ref 4.22–5.81)
RDW: 13.1 % (ref 11.5–15.5)
WBC: 9.1 10*3/uL (ref 4.0–10.5)
nRBC: 0 % (ref 0.0–0.2)

## 2021-06-04 LAB — BASIC METABOLIC PANEL
Anion gap: 5 (ref 5–15)
BUN: 16 mg/dL (ref 6–20)
CO2: 27 mmol/L (ref 22–32)
Calcium: 7.1 mg/dL — ABNORMAL LOW (ref 8.9–10.3)
Chloride: 112 mmol/L — ABNORMAL HIGH (ref 98–111)
Creatinine, Ser: 0.96 mg/dL (ref 0.61–1.24)
GFR, Estimated: >60 mL/min (ref >60)
Glucose, Bld: 107 mg/dL — ABNORMAL HIGH (ref 70–99)
Potassium: 3.1 mmol/L — ABNORMAL LOW (ref 3.5–5.1)
Sodium: 144 mmol/L (ref 135–145)

## 2021-06-04 MED ORDER — POTASSIUM CHLORIDE CRYS ER 20 MEQ PO TBCR
40.0000 meq | EXTENDED_RELEASE_TABLET | Freq: Once | ORAL | Status: AC
  Administered 2021-06-05: 40 meq via ORAL
  Filled 2021-06-04: qty 2

## 2021-06-04 MED ORDER — LIDOCAINE HCL (PF) 1 % IJ SOLN
INTRAMUSCULAR | Status: AC
  Filled 2021-06-04: qty 10

## 2021-06-04 MED ORDER — LIDOCAINE HCL (PF) 1 % IJ SOLN
10.0000 mL | Freq: Once | INTRAMUSCULAR | Status: AC
  Administered 2021-06-04: 10 mL
  Filled 2021-06-04: qty 10
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
## 2021-06-04 NOTE — ED Notes (Addendum)
Lawson Fiscal  (patient's fiance) contact info [phone] ?

## 2021-06-04 NOTE — Discharge Instructions (Signed)
Your stitches will dissolve on their own. The staples will need to be taken out in roughly 10 days, this can be done at your primary care doctors office. Please seek medical attention for any high fevers, chest pain, shortness of breath, change in behavior, persistent vomiting, bloody stool or any other new or concerning symptoms. ? ?

## 2021-06-04 NOTE — ED Provider Notes (Signed)
? ?[facility] ?Provider Note ? ? ? Event Date/Time  ? First MD Initiated Contact with Patient 06/04/21 2104   ?  (approximate) ? ? ?History  ? ?Fall and Laceration ? ? ?HPI ? ?Jack [name] is a 56 y.o. male  who, per office visit note from yesterday has history of HTN, HLD, MI, who presents to the emergency department today via EMS after a fall. Per report the patient fell and hit a toilet with his head with enough force to break the toilet. The patient himself is altered (per report had been drinking) and cannot give any significant history.  ? ?Physical Exam  ? ?Triage Vital Signs: ?ED Triage Vitals  ?Enc Vitals Group  ?   BP 06/04/21 2100 121/80  ?   Pulse Rate 06/04/21 2058 75  ?   Resp 06/04/21 2058 19  ?   Temp 06/04/21 2058 98.2 ?F (36.8 ?C)  ?   Temp Source 06/04/21 2058 Oral  ?   SpO2 06/04/21 2100 97 %  ?   Weight 06/04/21 2059 207 lb (93.9 kg)  ?   Height 06/04/21 2059 5\' 6"  (1.676 m)  ? ?Most recent vital signs: ?Vitals:  ? 06/04/21 2058 06/04/21 2100  ?BP:  121/80  ?Pulse: 75 77  ?Resp: 19 20  ?Temp: 98.2 ?F (36.8 ?C)   ?SpO2:  97%  ? ?General: Awake, altered. ?CV:  Good peripheral perfusion.  ?Resp:  Normal effort.  ?Abd:  No distention.  ?Skin:  Multiple lacerations to face. Abrasion to right forearm. Laceration to scalp.  ? ? ?ED Results / Procedures / Treatments  ? ?Labs ?(all labs ordered are listed, but only abnormal results are displayed) ?Labs Reviewed  ?CBC WITH DIFFERENTIAL/PLATELET - Abnormal; Notable for the following components:  ?    Result Value  ? RBC 4.18 (*)   ? Abs Immature Granulocytes 0.32 (*)   ? All other components within normal limits  ?BASIC METABOLIC PANEL - Abnormal; Notable for the following components:  ? Potassium 3.1 (*)   ? Chloride 112 (*)   ? Glucose, Bld 107 (*)   ? Calcium 7.1 (*)   ? All other components within normal limits  ? ? ? ?EKG ? ?[id number], attending physician, personally viewed and interpreted this EKG ? ?EKG Time:  2055 ?Rate: 78 ?Rhythm: ventriclar paced rhythm ?Axis: left axis deviation ?Intervals: qtc 516 ?QRS: wide ?ST changes: no st elevation ?Impression: abnormal ekg ? ?RADIOLOGY ?I independently interpreted and visualized the CT head/cervical spine/max face. My interpretation: No intracranial bleed. Nasal bone fracture. ?Radiology interpretation:  ?IMPRESSION:  ?CT of the head: No acute intracranial abnormality noted.  ?   ?CT of the maxillofacial bones: Slightly limited by patient motion  ?artifact. Chronic nasal bone fractures are seen. No acute fracture  ?is noted.  ?   ?Laceration involving the nose consistent with the given clinical  ?history.  ?   ?Mild sinus disease of a chronic nature.  ?   ?CT of the cervical spine: Multilevel degenerative changes without  ?acute bony abnormality.  ?   ? ? ?PROCEDURES: ? ?Critical Care performed: No ? ?Procedures ? ?LACERATION REPAIR ?Performed by: [id number] ?Authorized by: Phineas Semen ?Risks and benefits: risks, benefits and alternatives were discussed ?Consent given by: patient ?Patient identity confirmed: provided demographic data ?Prepped and Draped in normal sterile fashion ?Wound explored ? ?Laceration Location: scalp ? ?Laceration Length: 2 cm ? ?No Foreign Bodies seen or palpated ? ?Anesthesia:  local infiltration ? ?Local anesthetic: lidocaine 1% without epinephrine ? ?Anesthetic total: 1 ml ? ?Irrigation method: syringe ?Amount of cleaning: standard ? ?Skin closure: staples ? ?Number of staples: 2 ? ?Patient tolerance: Patient tolerated the procedure well with no immediate complications. ? ?LACERATION REPAIR ?Performed by: Phineas Semen ?Authorized by: Phineas Semen ?Risks and benefits: risks, benefits and alternatives were discussed ?Consent given by: patient ?Patient identity confirmed: provided demographic data ?Prepped and Draped in normal sterile fashion ?Wound explored ? ?Laceration Location: chin/nose ? ?Laceration Length: combined 4 cm ? ?No  Foreign Bodies seen or palpated ? ?Anesthesia: local infiltration ? ?Local anesthetic: lidocaine 1% without epinephrine ? ?Anesthetic total: 2 ml ? ?Irrigation method: syringe ?Amount of cleaning: standard ? ?Skin closure: 5-0 vicryl rapide ? ?Number of sutures: 10 ? ?Technique: simple interrupted ? ?Patient tolerance: Patient tolerated the procedure well with no immediate complications. ? ? ? ?MEDICATIONS ORDERED IN ED: ?Medications - No data to display ? ? ?IMPRESSION / MDM / ASSESSMENT AND PLAN / ED COURSE  ?I reviewed the triage vital signs and the nursing notes. ?             ?               ? ?Differential diagnosis includes, but is not limited to, intracranial bleed, intoxication, fracture of the face head or spine. ? ?Patient brought to the emergency department today by EMS after a fall.  Patient did admit to drinking some beers and is altered here in the emergency department.  Head CT cervical spine CT and max face CT were obtained.  Fortunately no intracranial bleed or acute osseous abnormality.  Patient did have lacerations to the scalp, nose and chin that required advanced closure.  Also has abrasions to the right forearm.  Repeat exams continue to show patient easily awoken however still appears slightly intoxicated.  At this time I do think would be reasonable for continued observation in the emergency department until patient becomes more lucid. ? ? ?FINAL CLINICAL IMPRESSION(S) / ED DIAGNOSES  ? ?Final diagnoses:  ?Injury of head, initial encounter  ?Fall, initial encounter  ? ? ? ?Note:  This document was prepared using Dragon voice recognition software and may include unintentional dictation errors. ? ?  ?Phineas Semen, MD ?06/04/21 2356 ? ?

## 2021-06-05 NOTE — ED Notes (Signed)
Pt encouraged to call ride home. Pt using personal cell phone to call someone to pick him up. ?

## 2021-06-05 NOTE — ED Notes (Signed)
Pt ambulated out of department with steady gait. Pt reports ride is here. No S/S of distress noted.  ?

## 2021-06-05 NOTE — ED Provider Notes (Signed)
----------------------------------------- ?  12:33 AM on 06/05/2021 ?----------------------------------------- ? ?Assuming care from Dr. Derrill Kay.  In short, Jack [name] is a 56 y.o. male with a chief complaint of intoxication with face/head injury.  Refer to the original H&P for additional details. ? ?The current plan of care is to allow the patient to metabolize the alcohol until he is clinically sober and able to go home. ? ? ?----------------------------------------- ?5:36 AM on 06/05/2021 ?----------------------------------------- ? ?Patient is awake, alert, oriented, clinically sober, tolerating oral intake.  Says the bed is uncomfortable and he is ready to go home.  I updated him about what happens, his laceration repair, sutures, staples, etc.  He is calling for a ride to come pick him up.  I encouraged outpatient follow-up and provided discharge instructions as per Dr. Derrill Kay. ?  ?[name], MD ?06/05/21 [phone] ? ?

## 2021-06-07 ENCOUNTER — Emergency Department: Payer: 59

## 2021-06-07 ENCOUNTER — Emergency Department
Admission: EM | Admit: 2021-06-07 | Discharge: 2021-06-07 | Disposition: A | Discharge status: Home / Self Care | Payer: 59 | Attending: Emergency Medicine | Admitting: Emergency Medicine

## 2021-06-07 ENCOUNTER — Other Ambulatory Visit: Payer: Self-pay

## 2021-06-07 DIAGNOSIS — I2510 Atherosclerotic heart disease of native coronary artery without angina pectoris: Secondary | ICD-10-CM | POA: Diagnosis not present

## 2021-06-07 DIAGNOSIS — W01198D Fall on same level from slipping, tripping and stumbling with subsequent striking against other object, subsequent encounter: Secondary | ICD-10-CM | POA: Insufficient documentation

## 2021-06-07 DIAGNOSIS — S060X1D Concussion with loss of consciousness of 30 minutes or less, subsequent encounter: Secondary | ICD-10-CM | POA: Insufficient documentation

## 2021-06-07 DIAGNOSIS — I10 Essential (primary) hypertension: Secondary | ICD-10-CM | POA: Diagnosis not present

## 2021-06-07 DIAGNOSIS — Z7982 Long term (current) use of aspirin: Secondary | ICD-10-CM | POA: Insufficient documentation

## 2021-06-07 DIAGNOSIS — R519 Headache, unspecified: Secondary | ICD-10-CM | POA: Diagnosis not present

## 2021-06-07 DIAGNOSIS — S0101XD Laceration without foreign body of scalp, subsequent encounter: Secondary | ICD-10-CM | POA: Diagnosis not present

## 2021-06-07 DIAGNOSIS — S0990XD Unspecified injury of head, subsequent encounter: Secondary | ICD-10-CM | POA: Diagnosis present

## 2021-06-07 DIAGNOSIS — R739 Hyperglycemia, unspecified: Secondary | ICD-10-CM | POA: Diagnosis not present

## 2021-06-07 DIAGNOSIS — Z950 Presence of cardiac pacemaker: Secondary | ICD-10-CM | POA: Insufficient documentation

## 2021-06-07 LAB — COMPREHENSIVE METABOLIC PANEL
ALT: 40 U/L (ref 0–44)
AST: 49 U/L — ABNORMAL HIGH (ref 15–41)
Albumin: 3.8 g/dL (ref 3.5–5.0)
Alkaline Phosphatase: 94 U/L (ref 38–126)
Anion gap: 15 (ref 5–15)
BUN: 22 mg/dL — ABNORMAL HIGH (ref 6–20)
CO2: 25 mmol/L (ref 22–32)
Calcium: 8.9 mg/dL (ref 8.9–10.3)
Chloride: 100 mmol/L (ref 98–111)
Creatinine, Ser: 1.25 mg/dL — ABNORMAL HIGH (ref 0.61–1.24)
GFR, Estimated: >60 mL/min (ref >60)
Glucose, Bld: 171 mg/dL — ABNORMAL HIGH (ref 70–99)
Potassium: 4.2 mmol/L (ref 3.5–5.1)
Sodium: 140 mmol/L (ref 135–145)
Total Bilirubin: 1.3 mg/dL — ABNORMAL HIGH (ref 0.3–1.2)
Total Protein: 7.6 g/dL (ref 6.5–8.1)

## 2021-06-07 LAB — CBC
HCT: 46.2 % (ref 39.0–52.0)
Hemoglobin: 15.3 g/dL (ref 13.0–17.0)
MCH: 31 pg (ref 26.0–34.0)
MCHC: 33.1 g/dL (ref 30.0–36.0)
MCV: 93.7 fL (ref 80.0–100.0)
Platelets: 162 10*3/uL (ref 150–400)
RBC: 4.93 MIL/uL (ref 4.22–5.81)
RDW: 12.8 % (ref 11.5–15.5)
WBC: 9.9 10*3/uL (ref 4.0–10.5)
nRBC: 0 % (ref 0.0–0.2)

## 2021-06-07 LAB — CBG MONITORING, ED: Glucose-Capillary: 117 mg/dL — ABNORMAL HIGH (ref 70–99)

## 2021-06-07 LAB — HEMOGLOBIN A1C
Hgb A1c MFr Bld: 5.9 % — ABNORMAL HIGH (ref 4.8–5.6)
Mean Plasma Glucose: 122.63 mg/dL

## 2021-06-07 LAB — TROPONIN I (HIGH SENSITIVITY): Troponin I (High Sensitivity): 17 ng/L (ref <18)

## 2021-06-07 MED ORDER — HYDROCODONE-ACETAMINOPHEN 5-325 MG PO TABS
1.0000 | ORAL_TABLET | Freq: Once | ORAL | Status: AC
  Administered 2021-06-07: 1 via ORAL
  Filled 2021-06-07: qty 1

## 2021-06-07 MED ORDER — OXYCODONE-ACETAMINOPHEN 5-325 MG PO TABS: 1.0000 | ORAL_TABLET | Freq: Three times a day (TID) | ORAL | 0 refills | Status: AC | PRN

## 2021-06-07 MED ORDER — PROCHLORPERAZINE EDISYLATE 10 MG/2ML IJ SOLN
10.0000 mg | Freq: Once | INTRAMUSCULAR | Status: AC
  Administered 2021-06-07: 10 mg via INTRAVENOUS
  Filled 2021-06-07: qty 2

## 2021-06-07 MED ORDER — LACTATED RINGERS IV BOLUS
500.0000 mL | Freq: Once | INTRAVENOUS | Status: AC
  Administered 2021-06-07: 500 mL via INTRAVENOUS

## 2021-06-07 NOTE — ED Provider Notes (Signed)
? ?[facility] ?Provider Note ? ? ? Event Date/Time  ? First MD Initiated Contact with Patient 06/07/21 [phone]   ?  (approximate) ? ? ?History  ? ?Dizziness and Headache ? ? ?HPI ? ?Jack [name] is a 56 y.o. 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  Per patient and review of records patient syncopized hitting his head and scraping his forearms.  Patient states he has had persistent headache and dizziness.  Also endorses intermittent blurry vision.  He has had blurry vision for couple weeks and is not actually sure how long he has had this but definitely before the fall.  He has a little soreness in his arms primarily around the region of his right forearm is not any different but no other new pain including any new neck pain, back pain, chest pain, abdominal pain or extremity pain.  He denies any interim falls.  He is only on a daily baby ASA but no other anticoagulation.  No fevers, sore throat, earache or other clear associated symptoms.  He denies any EtOH use the last couple days.  He has been taking Tylenol ibuprofen but does not feel he is of help for his headache much.  He has been trying to go to work but has been feeling too sick to. ? ?  ?Past Medical History:  ?Diagnosis Date  ? Anginal pain (HCC)   ? Coronary artery disease   ? Heart murmur   ? Hyperlipidemia   ? Hypertension   ? Myocardial infarction [facility](HCC)   ? ? ? ?Physical Exam  ?Triage Vital Signs: ?ED Triage Vitals  ?Enc Vitals Group  ?   BP 06/07/21 0825 (!) 144/101  ?   Pulse Rate 06/07/21 0825 94  ?   Resp 06/07/21 0825 17  ?   Temp 06/07/21 0825 98 ?F (36.7 ?C)  ?   Temp Source 06/07/21 0825 Oral  ?   SpO2 06/07/21 0825 98 %  ?    Weight 06/07/21 0826 202 lb (91.6 kg)  ?   Height 06/07/21 0826 5\' 6"  (1.676 m)  ?   Head Circumference --   ?   Peak Flow --   ?   Pain Score 06/07/21 0826 9  ?   Pain Loc --   ?   Pain Edu? --   ?   Excl. in GC? --   ? ? ?Most recent vital signs: ?Vitals:  ? 06/07/21 0825  ?BP: (!) 144/101  ?Pulse: 94  ?Resp: 17  ?Temp: 98 ?F (36.7 ?C)  ?SpO2: 98%  ? ? ?General: Awake, no distress.  ?CV:  Good peripheral perfusion.  2+ radial pulses. ?Resp:  Normal effort.  Clear bilaterally. ?Abd:  No distention.  Soft. ?Other:  Cranial nerves II through XII are grossly intact.  Nasal sutures placed previous visit appear clean dry intact with wound underlying appearing to be healing appropriately.  Some ecchymosis around the right periorbital tissues.  No other obvious trauma to the face.  Staples are in place over the right parietal scalp area.  This wound appears clean dry and intact.  No other areas of obvious trauma.  No tenderness of along the C/T point/L-spine.  Patient has  no pronator drift or finger dysmetria.  Symmetric strength throughout all extremities.  2+ radial pulses.  Visual acuity 20/15 bilaterally.  PERRLA.  EOMI.  Patient able to ambulate with steady gait unassisted. ? ? ?ED Results / Procedures / Treatments  ?Labs ?(all labs ordered are listed, but only abnormal results are displayed) ?Labs Reviewed  ?COMPREHENSIVE METABOLIC PANEL - Abnormal; Notable for the following components:  ?    Result Value  ? Glucose, Bld 171 (*)   ? BUN 22 (*)   ? Creatinine, Ser 1.25 (*)   ? AST 49 (*)   ? Total Bilirubin 1.3 (*)   ? All other components within normal limits  ?CBC  ?URINALYSIS, ROUTINE W REFLEX MICROSCOPIC  ?HEMOGLOBIN A1C  ?CBG MONITORING, ED  ?TROPONIN I (HIGH SENSITIVITY)  ? ? ? ?EKG ? ?ECG is remarkable for sinus rhythm with a ventricular rate of 86, normal axis, QTc interval of 503 and some nonspecific ST changes in aVL likely artifact in V6 without other clear evidence of acute ischemia or significant  arrhythmia ? ? ?RADIOLOGY ? ?CT head on my interpretation without evidence of intracranial hemorrhage, edema, mass effect, ischemia or other acute intracranial process.  Also reviewed radiology interpretation and agree with their findings of same. ? ? ?PROCEDURES: ? ?Critical Care performed: No ? ?.1-3 Lead EKG Interpretation ?Performed by: Gilles Chiquito, MD ?Authorized by: Gilles Chiquito, MD  ? ?  Interpretation: normal   ?  ECG rate assessment: normal   ?  Rhythm: sinus rhythm   ?  Ectopy: none   ?  Conduction: normal   ? ?The patient is on the cardiac monitor to evaluate for evidence of arrhythmia and/or significant heart rate changes. ? ? ?MEDICATIONS ORDERED IN ED: ?Medications  ?HYDROcodone-acetaminophen (NORCO/VICODIN) 5-325 MG per tablet 1 tablet (has no administration in time range)  ?prochlorperazine (COMPAZINE) injection 10 mg (10 mg Intravenous Given 06/07/21 0930)  ?lactated ringers bolus 500 mL (500 mLs Intravenous New Bag/Given 06/07/21 0930)  ? ? ? ?IMPRESSION / MDM / ASSESSMENT AND PLAN / ED COURSE  ?I reviewed the triage vital signs and the nursing notes. ?             ?               ? ?Differential diagnosis includes, but is not limited to symptoms related to traumatic brain injury, delayed intracranial bleeding, metabolic derangements, arrhythmia, and anemia with lower suspicion at this time for CVA given absence of any focal neurological deficits or interim trauma given patient denies daily EtOH use and reports no interim trauma.  No findings to suggest acute infectious process at this time. ? ?CT head on my interpretation without evidence of intracranial hemorrhage, edema, mass effect, ischemia or other acute intracranial process.  Also reviewed radiology interpretation and agree with their findings of same. ? ?ECG and nonelevated troponin are not suggestive of arrhythmia or atypical presentation of ischemia.  CMP shows a glucose of 171 and a creatinine of 1.25 compared to 0.963 days ago.   No other significant electrolyte or metabolic derangements.  CBC shows no leukocytosis or acute anemia. ? ?Discussed with patient lactic is mildly dehydrated likely contributing to his symptoms from a concussion i.e. traumatic brain injury.  Also advised him of hyperglycemia and I will send an A1c test today which he can follow-up with his PCP.  He was given some Compazine, Norco and some IV fluids.  Advised him to hold his Lasix for  48 hours until he can have his kidney function rechecked by his PCP and discussed resuming it.  Advised to only drink water and avoid soda or juice.  He has had significant difficulty getting any sleep due to severe headache the last couple days which I think is from traumatic brain injury.  Will provide a very short course of Percocet to help with this as patient states he feels he needs something stronger than Compazine help him sleep and he has been taking Tylenol ibuprofen without any relief.  I will have him follow-up with neurology for this. ? ?Discharged in stable condition.  Strict return precautions advised discussed.  Discussed expected clinical course of concussion and getting adequate rest.  Emphasized importance of returning for any new or worsening of symptoms.  Also discussed avoiding any alcohol as this may have contributed to his syncope per review of records and likely is not helping his blood pressure. ?  ? ? ?FINAL CLINICAL IMPRESSION(S) / ED DIAGNOSES  ? ?Final diagnoses:  ?Bad headache  ?Hyperglycemia  ?Concussion with loss of consciousness of 30 minutes or less, subsequent encounter  ? ? ? ?Rx / DC Orders  ? ?ED Discharge Orders   ? ?      Ordered  ?  oxyCODONE-acetaminophen (PERCOCET) 5-325 MG tablet  Every 8 hours PRN       ? 06/07/21 1740  ? ?  ?  ? ?  ? ? ? ?Note:  This document was prepared using Dragon voice recognition software and may include unintentional dictation errors. ?  ?Gilles Chiquito, MD ?06/07/21 [phone] ? ?

## 2021-06-07 NOTE — ED Triage Notes (Signed)
Pt had a syncopal episode 3/24 causing him to hit his head on the toilet ,pt c/o HA and dizziness that has continued since the fall/syncope. Pt is ambulatory with a steady gait on arrival. Pt is concerned with dizziness due to a hx of open heart surgery with heart valve replacement last year ?

## 2021-06-07 NOTE — Discharge Instructions (Addendum)
Do not combine Ativan and Percocet.  This is a change accommodation that can cause respiratory depression and death if you combine these medications.  In addition as we discussed do not operate heavy machinery, drive or do anything that may put yourself or others at risk if you are sedated while taking Percocet. ?

## 2021-06-07 NOTE — ED Notes (Signed)
Pt to ED for continued HA after fall several days ago. EDP was just at bedside. Pt endorses some dizziness and blurry vision as well (both eyes). NIH negative. ?

## 2021-06-15 ENCOUNTER — Ambulatory Visit: Payer: Medicaid Other | Admitting: Internal Medicine

## 2021-07-01 ENCOUNTER — Other Ambulatory Visit: Payer: Self-pay

## 2021-07-01 ENCOUNTER — Emergency Department: Payer: 59

## 2021-07-01 ENCOUNTER — Emergency Department
Admission: EM | Admit: 2021-07-01 | Discharge: 2021-07-01 | Disposition: A | Discharge status: Home / Self Care | Payer: 59 | Attending: Emergency Medicine | Admitting: Emergency Medicine

## 2021-07-01 DIAGNOSIS — E860 Dehydration: Secondary | ICD-10-CM | POA: Insufficient documentation

## 2021-07-01 DIAGNOSIS — Z20822 Contact with and (suspected) exposure to covid-19: Secondary | ICD-10-CM | POA: Insufficient documentation

## 2021-07-01 DIAGNOSIS — R42 Dizziness and giddiness: Secondary | ICD-10-CM | POA: Insufficient documentation

## 2021-07-01 DIAGNOSIS — J0110 Acute frontal sinusitis, unspecified: Secondary | ICD-10-CM | POA: Insufficient documentation

## 2021-07-01 DIAGNOSIS — R509 Fever, unspecified: Secondary | ICD-10-CM | POA: Diagnosis present

## 2021-07-01 LAB — URINALYSIS, ROUTINE W REFLEX MICROSCOPIC
Bacteria, UA: NONE SEEN
Bilirubin Urine: NEGATIVE
Glucose, UA: NEGATIVE mg/dL
Ketones, ur: 20 mg/dL — AB
Leukocytes,Ua: NEGATIVE
Nitrite: NEGATIVE
Protein, ur: NEGATIVE mg/dL
Specific Gravity, Urine: 1.01 (ref 1.005–1.030)
Squamous Epithelial / HPF: NONE SEEN (ref 0–5)
pH: 5 (ref 5.0–8.0)

## 2021-07-01 LAB — CBC
HCT: 40.3 % (ref 39.0–52.0)
Hemoglobin: 13.9 g/dL (ref 13.0–17.0)
MCH: 31.4 pg (ref 26.0–34.0)
MCHC: 34.5 g/dL (ref 30.0–36.0)
MCV: 91.2 fL (ref 80.0–100.0)
Platelets: 114 10*3/uL — ABNORMAL LOW (ref 150–400)
RBC: 4.42 MIL/uL (ref 4.22–5.81)
RDW: 12.7 % (ref 11.5–15.5)
WBC: 10.8 10*3/uL — ABNORMAL HIGH (ref 4.0–10.5)
nRBC: 0 % (ref 0.0–0.2)

## 2021-07-01 LAB — RESP PANEL BY RT-PCR (FLU A&B, COVID) ARPGX2
Influenza A by PCR: NEGATIVE
Influenza B by PCR: NEGATIVE
SARS Coronavirus 2 by RT PCR: NEGATIVE

## 2021-07-01 LAB — DIFFERENTIAL
Abs Immature Granulocytes: 0.08 10*3/uL — ABNORMAL HIGH (ref 0.00–0.07)
Basophils Absolute: 0 10*3/uL (ref 0.0–0.1)
Basophils Relative: 0 %
Eosinophils Absolute: 0 10*3/uL (ref 0.0–0.5)
Eosinophils Relative: 0 %
Immature Granulocytes: 1 %
Lymphocytes Relative: 5 %
Lymphs Abs: 0.6 10*3/uL — ABNORMAL LOW (ref 0.7–4.0)
Monocytes Absolute: 0.8 10*3/uL (ref 0.1–1.0)
Monocytes Relative: 8 %
Neutro Abs: 9.3 10*3/uL — ABNORMAL HIGH (ref 1.7–7.7)
Neutrophils Relative %: 86 %

## 2021-07-01 LAB — CBG MONITORING, ED: Glucose-Capillary: 124 mg/dL — ABNORMAL HIGH (ref 70–99)

## 2021-07-01 LAB — APTT: aPTT: 30 seconds (ref 24–36)

## 2021-07-01 LAB — COMPREHENSIVE METABOLIC PANEL
ALT: 22 U/L (ref 0–44)
AST: 27 U/L (ref 15–41)
Albumin: 4.2 g/dL (ref 3.5–5.0)
Alkaline Phosphatase: 77 U/L (ref 38–126)
Anion gap: 11 (ref 5–15)
BUN: 18 mg/dL (ref 6–20)
CO2: 24 mmol/L (ref 22–32)
Calcium: 8.6 mg/dL — ABNORMAL LOW (ref 8.9–10.3)
Chloride: 91 mmol/L — ABNORMAL LOW (ref 98–111)
Creatinine, Ser: 1.15 mg/dL (ref 0.61–1.24)
GFR, Estimated: >60 mL/min (ref >60)
Glucose, Bld: 111 mg/dL — ABNORMAL HIGH (ref 70–99)
Potassium: 3.1 mmol/L — ABNORMAL LOW (ref 3.5–5.1)
Sodium: 126 mmol/L — ABNORMAL LOW (ref 135–145)
Total Bilirubin: 1 mg/dL (ref 0.3–1.2)
Total Protein: 7.7 g/dL (ref 6.5–8.1)

## 2021-07-01 LAB — LACTIC ACID, PLASMA
Lactic Acid, Venous: 1.1 mmol/L (ref 0.5–1.9)
Lactic Acid, Venous: 1.1 mmol/L (ref 0.5–1.9)

## 2021-07-01 LAB — PROTIME-INR
INR: 1.1 (ref 0.8–1.2)
Prothrombin Time: 13.6 seconds (ref 11.4–15.2)

## 2021-07-01 MED ORDER — ACETAMINOPHEN 325 MG PO TABS
650.0000 mg | ORAL_TABLET | Freq: Once | ORAL | Status: AC
  Administered 2021-07-01: 650 mg via ORAL
  Filled 2021-07-01: qty 2

## 2021-07-01 MED ORDER — SODIUM CHLORIDE 0.9% FLUSH
3.0000 mL | Freq: Once | INTRAVENOUS | Status: AC
  Administered 2021-07-01: 3 mL via INTRAVENOUS

## 2021-07-01 MED ORDER — KETOROLAC TROMETHAMINE 15 MG/ML IJ SOLN
15.0000 mg | Freq: Once | INTRAMUSCULAR | Status: AC
  Administered 2021-07-01: 15 mg via INTRAVENOUS
  Filled 2021-07-01: qty 1

## 2021-07-01 MED ORDER — SODIUM CHLORIDE 0.9 % IV BOLUS
1000.0000 mL | Freq: Once | INTRAVENOUS | Status: AC
  Administered 2021-07-01: 1000 mL via INTRAVENOUS

## 2021-07-01 MED ORDER — AMOXICILLIN-POT CLAVULANATE 875-125 MG PO TABS: 1.0000 | ORAL_TABLET | Freq: Two times a day (BID) | ORAL | 0 refills | Status: DC

## 2021-07-01 MED ORDER — ONDANSETRON HCL 4 MG/2ML IJ SOLN
4.0000 mg | Freq: Once | INTRAMUSCULAR | Status: AC
  Administered 2021-07-01: 4 mg via INTRAVENOUS
  Filled 2021-07-01: qty 2

## 2021-07-01 NOTE — ED Notes (Signed)
Pt denies any recent illness, denies painful urination, denies N/V/D, or cough congestion. ? 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

## 2021-07-01 NOTE — ED Notes (Signed)
E signature pad not working. Pt educated on discharge instructions and verbalized understanding.  

## 2021-07-01 NOTE — ED Provider Notes (Signed)
? ?[facility] ?Provider Note ? ? ? Event Date/Time  ? First MD Initiated Contact with Patient 07/01/21 1500   ?  (approximate) ? ? ?History  ? ?Stroke Symptoms and Fever ? ? ?HPI ? ?Jack [name] is a 56 y.o. male with a history of GERD, alcohol abuse is brought to the ED today due to an episode of feeling disoriented that happened this morning around 7:00 AM.  He reports having bilateral frontal headache behind his eyes which also started this morning.  He denies any recent fever or chills..  Denies any motor weakness or paresthesia, no vision changes, no recent head trauma. ? ?He was found to have a temperature of 103 on arrival to the ED.  This is improved with Tylenol.  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 ? ?Denies any chest pain shortness of breath abdominal pain vomiting diarrhea cough or dysuria.  No dizziness or syncope.  Denies any recent trauma. ?  ? ? ?Physical Exam  ? ?Triage Vital Signs: ?ED Triage Vitals  ?Enc Vitals Group  ?   BP 07/01/21 1409 112/88  ?   Pulse Rate 07/01/21 1409 100  ?   Resp 07/01/21 1409 18  ?   Temp 07/01/21 1409 (!) 103.2 ?F (39.6 ?C)  ?   Temp Source 07/01/21 1409 Oral  ?   SpO2 07/01/21 1409 96 %  ?   Weight 07/01/21 1403 185 lb (83.9 kg)  ?   Height 07/01/21 1403 5\' 6"  (1.676 m)  ?   Head Circumference --   ?   Peak Flow --   ?   Pain Score 07/01/21 1403 8  ?   Pain Loc --   ?   Pain Edu? --   ?   Excl. in GC? --   ? ? ?Most recent vital signs: ?Vitals:  ? 07/01/21 1409 07/01/21 1502  ?BP: 112/88 123/63  ?Pulse: 100 91  ?Resp: 18 (!) 22  ?Temp: (!) 103.2 ?F (39.6 ?C) (!) 100.4 ?F (38 ?C)  ?SpO2: 96% 96%  ? ? ? ?General: Awake, no distress.  ?CV:  Good peripheral perfusion.  Regular rate rhythm ?Resp:  Normal effort.  Clear to auscultation bilaterally, no crackles or wheezing ?Abd:  No distention.  Soft and nontender ?Other:  No lower extremity  edema, no inflammatory soft tissue changes.  No rash.  No meningismus.  Dry mucous membranes.  There are some mild pain with percussion of the frontal sinuses. ? ? ?ED Results / Procedures / Treatments  ? ?Labs ?(all labs ordered are listed, but only abnormal results are displayed) ?Labs Reviewed  ?CBC - Abnormal; Notable for the following components:  ?    Result Value  ? WBC 10.8 (*)   ? Platelets 114 (*)   ? All other components within normal limits  ?DIFFERENTIAL - Abnormal; Notable for the following components:  ? Neutro Abs 9.3 (*)   ? Lymphs Abs 0.6 (*)   ? Abs Immature Granulocytes 0.08 (*)   ? All other components within normal limits  ?COMPREHENSIVE METABOLIC PANEL - Abnormal; Notable for the following components:  ? Sodium 126 (*)   ? Potassium 3.1 (*)   ? Chloride 91 (*)   ? Glucose, Bld 111 (*)   ? Calcium 8.6 (*)   ? All other components within normal limits  ?URINALYSIS, ROUTINE  W REFLEX MICROSCOPIC - Abnormal; Notable for the following components:  ? Color, Urine YELLOW (*)   ? APPearance CLEAR (*)   ? Hgb urine dipstick SMALL (*)   ? Ketones, ur 20 (*)   ? All other components within normal limits  ?CBG MONITORING, ED - Abnormal; Notable for the following components:  ? Glucose-Capillary 124 (*)   ? All other components within normal limits  ?CULTURE, BLOOD (ROUTINE X 2)  ?CULTURE, BLOOD (ROUTINE X 2)  ?URINE CULTURE  ?RESP PANEL BY RT-PCR (FLU A&B, COVID) ARPGX2  ?PROTIME-INR  ?APTT  ?LACTIC ACID, PLASMA  ?LACTIC ACID, PLASMA  ? ? ? ?EKG ? ?Interpreted by me ?Ventricular paced rhythm, rate of 98.  Left axis, right bundle branch block.  No acute ischemic changes. ? ? ?RADIOLOGY ?Chest x-ray viewed and interpreted by me, appears normal.  Radiology report reviewed. ? ?CT head unremarkable except for frontal sinusitis. ? ? ? ?PROCEDURES: ? ?Critical Care performed: No ? ?Procedures ? ? ?MEDICATIONS ORDERED IN ED: ?Medications  ?sodium chloride flush (NS) 0.9 % injection 3 mL (3 mLs Intravenous Given  07/01/21 1505)  ?acetaminophen (TYLENOL) tablet 650 mg (650 mg Oral Given 07/01/21 1416)  ?ketorolac (TORADOL) 15 MG/ML injection 15 mg (15 mg Intravenous Given 07/01/21 1549)  ?sodium chloride 0.9 % bolus 1,000 mL (1,000 mLs Intravenous New Bag/Given 07/01/21 1550)  ?ondansetron (ZOFRAN) injection 4 mg (4 mg Intravenous Given 07/01/21 1554)  ? ? ? ?IMPRESSION / MDM / ASSESSMENT AND PLAN / ED COURSE  ?I reviewed the triage vital signs and the nursing notes. ?             ?               ? ?Differential diagnosis includes, but is not limited to, dehydration, electrolyte abnormality, intracranial hemorrhage, pneumonia, UTI, viral illness, anemia, fatigue ? ?Patient presents with episode of feeling disoriented without any focal neurodeficits.  In the ED his neuro exam is normal.  He feels back to normal with fever control.  Other vital signs are unremarkable.  He is nontoxic.  No leukocytosis, normal lactic acid level and organ function.  Sepsis is ruled out. ? ?Discussed hospitalization with the patient but he is eager to be discharged home, which I think would be reasonable as his symptoms are relatively minor. ?Given a saline bolus for hydration.  I am awaiting results of a COVID swab and urinalysis and will plan to start him on Augmentin for sinusitis, which is a new acute issue for him today..   ? ?Labs Show hyponatremia but he is not symptomatic from this perspective. ? ?Presentation is not consistent with meningitis or encephalitis ? ?  ? ? ?FINAL CLINICAL IMPRESSION(S) / ED DIAGNOSES  ? ?Final diagnoses:  ?Acute non-recurrent frontal sinusitis  ?Dehydration  ? ? ? ?Rx / DC Orders  ? ?ED Discharge Orders   ? ? None  ? ?  ? ? ? ?Note:  This document was prepared using Dragon voice recognition software and may include unintentional dictation errors. ?  ?Sharman Cheek, MD ?07/01/21 1716 ? ?

## 2021-07-01 NOTE — ED Triage Notes (Signed)
Pt c/o feeling disoriented and not able to find his words while on the way to work this morning around 7am, pt c/o HA "behind my eyes". No weakness, no drift noted, no slurred speech, no loss of sensation ?

## 2021-07-03 LAB — URINE CULTURE: Culture: <10000 — AB

## 2021-07-05 ENCOUNTER — Encounter: Payer: Self-pay | Admitting: Internal Medicine

## 2021-07-05 ENCOUNTER — Ambulatory Visit (INDEPENDENT_AMBULATORY_CARE_PROVIDER_SITE_OTHER): Payer: 59 | Admitting: Internal Medicine

## 2021-07-05 VITALS — BP 140/80 | HR 84 | Ht 66.0 in | Wt 205.6 lb

## 2021-07-05 DIAGNOSIS — I350 Nonrheumatic aortic (valve) stenosis: Secondary | ICD-10-CM

## 2021-07-05 DIAGNOSIS — I10 Essential (primary) hypertension: Secondary | ICD-10-CM

## 2021-07-05 DIAGNOSIS — E782 Mixed hyperlipidemia: Secondary | ICD-10-CM

## 2021-07-05 DIAGNOSIS — I2510 Atherosclerotic heart disease of native coronary artery without angina pectoris: Secondary | ICD-10-CM | POA: Diagnosis not present

## 2021-07-05 DIAGNOSIS — K219 Gastro-esophageal reflux disease without esophagitis: Secondary | ICD-10-CM

## 2021-07-05 DIAGNOSIS — F419 Anxiety disorder, unspecified: Secondary | ICD-10-CM | POA: Diagnosis not present

## 2021-07-05 MED ORDER — ALPRAZOLAM 0.5 MG PO TABS: 0.5000 mg | ORAL_TABLET | Freq: Two times a day (BID) | ORAL | 0 refills | Status: DC | PRN

## 2021-07-05 NOTE — Assessment & Plan Note (Signed)
-   The patient's GERD is stable on medication.  - Instructed the patient to avoid eating spicy and acidic foods, as well as foods high in fat. - Instructed the patient to avoid eating large meals or meals 2-3 hours prior to sleeping. 

## 2021-07-05 NOTE — Assessment & Plan Note (Signed)
Patient denies any chest pain or syncope chest is clear ?
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
## 2021-07-05 NOTE — Assessment & Plan Note (Signed)

## 2021-07-05 NOTE — Assessment & Plan Note (Signed)
?-   Encouraged patient to engage in relaxing activities like yoga, meditation, journaling, going for a walk, or participating in a hobby.  ?- Encouraged patient to reach out to trusted friends or family members about recent struggles, ?Patient was advised to read A book, how to stop worrying and start living, it is good book to read to control  the stress ? ?

## 2021-07-05 NOTE — Assessment & Plan Note (Signed)
Patient does not have any angina.  There is no pedal edema shortness of breath is due to chronic pulmonary disease ?

## 2021-07-05 NOTE — Assessment & Plan Note (Signed)
Hypercholesterolemia  I advised the patient to follow Mediterranean diet This diet is rich in fruits vegetables and whole grain, and This diet is also rich in fish and lean meat Patient should also eat a handful of almonds or walnuts daily Recent heart study indicated that average follow-up on this kind of diet reduces the cardiovascular mortality by 50 to 70%== 

## 2021-07-06 LAB — CULTURE, BLOOD (ROUTINE X 2)
Culture: NO GROWTH
Culture: NO GROWTH
Special Requests: ADEQUATE

## 2021-08-03 ENCOUNTER — Encounter: Payer: Self-pay | Admitting: Internal Medicine

## 2021-08-03 ENCOUNTER — Ambulatory Visit (INDEPENDENT_AMBULATORY_CARE_PROVIDER_SITE_OTHER): Payer: 59 | Admitting: Internal Medicine

## 2021-08-03 VITALS — BP 140/84 | HR 74 | Ht 65.0 in | Wt 201.1 lb

## 2021-08-03 DIAGNOSIS — I10 Essential (primary) hypertension: Secondary | ICD-10-CM

## 2021-08-03 DIAGNOSIS — F419 Anxiety disorder, unspecified: Secondary | ICD-10-CM

## 2021-08-03 DIAGNOSIS — Z720 Tobacco use: Secondary | ICD-10-CM

## 2021-08-03 DIAGNOSIS — E669 Obesity, unspecified: Secondary | ICD-10-CM

## 2021-08-03 DIAGNOSIS — K219 Gastro-esophageal reflux disease without esophagitis: Secondary | ICD-10-CM | POA: Diagnosis not present

## 2021-08-03 DIAGNOSIS — Q230 Congenital stenosis of aortic valve: Secondary | ICD-10-CM | POA: Diagnosis not present

## 2021-08-03 DIAGNOSIS — R062 Wheezing: Secondary | ICD-10-CM

## 2021-08-03 DIAGNOSIS — Q231 Congenital insufficiency of aortic valve: Secondary | ICD-10-CM

## 2021-08-03 MED ORDER — ALPRAZOLAM 0.5 MG PO TABS: 0.5000 mg | ORAL_TABLET | Freq: Two times a day (BID) | ORAL | 0 refills | Status: DC | PRN

## 2021-08-03 MED ORDER — POTASSIUM CHLORIDE CRYS ER 20 MEQ PO TBCR: 20.0000 meq | EXTENDED_RELEASE_TABLET | Freq: Two times a day (BID) | ORAL | 3 refills | Status: DC

## 2021-08-03 NOTE — Assessment & Plan Note (Signed)
Patient does not smoke anymore ?

## 2021-08-03 NOTE — Assessment & Plan Note (Signed)
-   Patient experiencing high levels of anxiety.  - Encouraged patient to engage in relaxing activities like yoga, meditation, journaling, going for a walk, or participating in a hobby.  - Encouraged patient to reach out to trusted friends or family members about recent struggles, Patient was advised to read A book, how to stop worrying and start living, it is good book to read to control  the stress  

## 2021-08-03 NOTE — Assessment & Plan Note (Signed)
-   The patient's GERD is stable on medication.  - Instructed the patient to avoid eating spicy and acidic foods, as well as foods high in fat. - Instructed the patient to avoid eating large meals or meals 2-3 hours prior to sleeping. 

## 2021-08-03 NOTE — Assessment & Plan Note (Signed)

## 2021-08-03 NOTE — Assessment & Plan Note (Signed)
Chest examination does not show any wheezing
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
## 2021-08-03 NOTE — Assessment & Plan Note (Signed)

## 2021-08-03 NOTE — Assessment & Plan Note (Signed)
Patient denies any chest pain or shortness of breath.  He still have a systolic murmur of the heart along the left sternal border.  Chest is clear

## 2021-08-31 ENCOUNTER — Ambulatory Visit (INDEPENDENT_AMBULATORY_CARE_PROVIDER_SITE_OTHER): Payer: 59 | Admitting: Internal Medicine

## 2021-08-31 ENCOUNTER — Encounter: Payer: Self-pay | Admitting: Internal Medicine

## 2021-08-31 VITALS — BP 137/80 | HR 60 | Ht 65.0 in | Wt 193.4 lb

## 2021-08-31 DIAGNOSIS — I10 Essential (primary) hypertension: Secondary | ICD-10-CM

## 2021-08-31 DIAGNOSIS — Q230 Congenital stenosis of aortic valve: Secondary | ICD-10-CM | POA: Diagnosis not present

## 2021-08-31 DIAGNOSIS — I2510 Atherosclerotic heart disease of native coronary artery without angina pectoris: Secondary | ICD-10-CM | POA: Diagnosis not present

## 2021-08-31 DIAGNOSIS — K219 Gastro-esophageal reflux disease without esophagitis: Secondary | ICD-10-CM

## 2021-08-31 DIAGNOSIS — Q231 Congenital insufficiency of aortic valve: Secondary | ICD-10-CM

## 2021-08-31 DIAGNOSIS — F419 Anxiety disorder, unspecified: Secondary | ICD-10-CM | POA: Diagnosis not present

## 2021-08-31 MED ORDER — TADALAFIL 10 MG PO TABS: 10.0000 mg | ORAL_TABLET | ORAL | 1 refills | Status: AC | PRN

## 2021-08-31 MED ORDER — ALPRAZOLAM 0.5 MG PO TABS: 0.5000 mg | ORAL_TABLET | Freq: Two times a day (BID) | ORAL | 0 refills | Status: DC | PRN

## 2021-08-31 NOTE — Assessment & Plan Note (Signed)
Aortic valve is doing well patient still have 2/ 6 aortic murmur

## 2021-08-31 NOTE — Assessment & Plan Note (Signed)
Denies any chest pain suggestive of angina, There is no arrhythmia, chest is clear
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
## 2021-08-31 NOTE — Assessment & Plan Note (Signed)
-   The patient's GERD is stable on medication.  - Instructed the patient to avoid eating spicy and acidic foods, as well as foods high in fat. - Instructed the patient to avoid eating large meals or meals 2-3 hours prior to sleeping. 

## 2021-08-31 NOTE — Assessment & Plan Note (Signed)
   Keeping a stress/anxiety diary. This can help you learn what triggers your reaction and then learn ways to manage your response.  Thinking about how you react to certain situations. You may not be able to control everything, but you can control your response.  Making time for activities that help you relax and not feeling guilty about spending your time in this way.  Visual imagery and yoga can help you stay calm and relax.  Patient was advised to lose weight

## 2021-08-31 NOTE — Assessment & Plan Note (Signed)

## 2021-09-03 ENCOUNTER — Other Ambulatory Visit: Payer: Self-pay | Admitting: Internal Medicine

## 2021-09-03 DIAGNOSIS — I10 Essential (primary) hypertension: Secondary | ICD-10-CM

## 2021-09-06 ENCOUNTER — Other Ambulatory Visit: Payer: Self-pay | Admitting: *Deleted

## 2021-09-06 DIAGNOSIS — I10 Essential (primary) hypertension: Secondary | ICD-10-CM

## 2021-09-28 ENCOUNTER — Ambulatory Visit (INDEPENDENT_AMBULATORY_CARE_PROVIDER_SITE_OTHER): Payer: 59 | Admitting: Internal Medicine

## 2021-09-28 ENCOUNTER — Encounter: Payer: Self-pay | Admitting: Internal Medicine

## 2021-09-28 VITALS — BP 127/85 | HR 87 | Ht 65.0 in | Wt 196.3 lb

## 2021-09-28 DIAGNOSIS — Q231 Congenital insufficiency of aortic valve: Secondary | ICD-10-CM

## 2021-09-28 DIAGNOSIS — I2510 Atherosclerotic heart disease of native coronary artery without angina pectoris: Secondary | ICD-10-CM | POA: Diagnosis not present

## 2021-09-28 DIAGNOSIS — F419 Anxiety disorder, unspecified: Secondary | ICD-10-CM

## 2021-09-28 DIAGNOSIS — Q230 Congenital stenosis of aortic valve: Secondary | ICD-10-CM | POA: Diagnosis not present

## 2021-09-28 DIAGNOSIS — Z720 Tobacco use: Secondary | ICD-10-CM

## 2021-09-28 DIAGNOSIS — I10 Essential (primary) hypertension: Secondary | ICD-10-CM

## 2021-09-28 MED ORDER — ALPRAZOLAM 0.5 MG PO TABS: 0.5000 mg | ORAL_TABLET | Freq: Two times a day (BID) | ORAL | 0 refills | Status: DC | PRN

## 2021-09-28 NOTE — Assessment & Plan Note (Signed)
-   I instructed the patient to stop smoking and provided them with smoking cessation materials.  - I informed the patient that smoking puts them at increased risk for cancer, COPD, hypertension, and more.  - Informed the patient to seek help if they begin to have trouble breathing, develop chest pain, start to cough up blood, feel faint, or pass out.  
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
## 2021-09-28 NOTE — Assessment & Plan Note (Signed)
Patient does not have any angina heart is regular chest is clear

## 2021-09-28 NOTE — Assessment & Plan Note (Signed)
Stable at the present time. 

## 2021-09-28 NOTE — Assessment & Plan Note (Signed)
-   Patient experiencing high levels of anxiety.  - Encouraged patient to engage in relaxing activities like yoga, meditation, journaling, going for a walk, or participating in a hobby.  - Encouraged patient to reach out to trusted friends or family members about recent struggles, Patient was advised to read A book, how to stop worrying and start living, it is good book to read to control  the stress  

## 2021-09-28 NOTE — Assessment & Plan Note (Signed)

## 2021-09-29 ENCOUNTER — Other Ambulatory Visit: Payer: Self-pay | Admitting: Internal Medicine

## 2021-09-29 DIAGNOSIS — F419 Anxiety disorder, unspecified: Secondary | ICD-10-CM

## 2021-11-02 ENCOUNTER — Ambulatory Visit (INDEPENDENT_AMBULATORY_CARE_PROVIDER_SITE_OTHER): Payer: 59 | Admitting: Internal Medicine

## 2021-11-02 ENCOUNTER — Ambulatory Visit: Payer: 59 | Admitting: Internal Medicine

## 2021-11-02 DIAGNOSIS — K219 Gastro-esophageal reflux disease without esophagitis: Secondary | ICD-10-CM

## 2021-11-02 DIAGNOSIS — F419 Anxiety disorder, unspecified: Secondary | ICD-10-CM

## 2021-11-02 DIAGNOSIS — I350 Nonrheumatic aortic (valve) stenosis: Secondary | ICD-10-CM

## 2021-11-02 DIAGNOSIS — I2510 Atherosclerotic heart disease of native coronary artery without angina pectoris: Secondary | ICD-10-CM

## 2021-11-02 DIAGNOSIS — I10 Essential (primary) hypertension: Secondary | ICD-10-CM

## 2021-11-02 DIAGNOSIS — U071 COVID-19: Secondary | ICD-10-CM

## 2021-11-02 LAB — POC COVID19 BINAXNOW: SARS Coronavirus 2 Ag: POSITIVE — AB

## 2021-11-02 MED ORDER — ALPRAZOLAM 0.5 MG PO TABS: 0.5000 mg | ORAL_TABLET | Freq: Two times a day (BID) | ORAL | 0 refills | Status: DC | PRN

## 2021-11-10 ENCOUNTER — Other Ambulatory Visit: Payer: Self-pay | Admitting: Internal Medicine

## 2021-11-10 DIAGNOSIS — I10 Essential (primary) hypertension: Secondary | ICD-10-CM

## 2021-11-11 ENCOUNTER — Encounter: Payer: Self-pay | Admitting: Internal Medicine

## 2021-11-11 NOTE — Assessment & Plan Note (Signed)
Patient does not have any angina, heart is regular chest no rales or rhonchi.  He has a systolic murmur along the left sternal border

## 2021-11-11 NOTE — Assessment & Plan Note (Signed)
No history of syncope. ?

## 2021-11-11 NOTE — Telephone Encounter (Signed)
Medication refill
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
## 2021-11-11 NOTE — Assessment & Plan Note (Signed)

## 2021-11-11 NOTE — Assessment & Plan Note (Signed)

## 2021-12-02 ENCOUNTER — Other Ambulatory Visit: Payer: Self-pay | Admitting: Nurse Practitioner

## 2021-12-02 ENCOUNTER — Ambulatory Visit: Payer: 59 | Admitting: Nurse Practitioner

## 2021-12-02 DIAGNOSIS — F419 Anxiety disorder, unspecified: Secondary | ICD-10-CM

## 2021-12-02 MED ORDER — ALPRAZOLAM 0.5 MG PO TABS: 0.5000 mg | ORAL_TABLET | Freq: Two times a day (BID) | ORAL | 0 refills | Status: DC | PRN
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
## 2021-12-02 NOTE — Assessment & Plan Note (Signed)
Stable on medication.   Refilled Xanax 

## 2022-01-03 ENCOUNTER — Ambulatory Visit (INDEPENDENT_AMBULATORY_CARE_PROVIDER_SITE_OTHER): Payer: 59 | Admitting: Internal Medicine

## 2022-01-03 ENCOUNTER — Encounter: Payer: Self-pay | Admitting: Internal Medicine

## 2022-01-03 ENCOUNTER — Encounter: Payer: Self-pay | Admitting: *Deleted

## 2022-01-03 DIAGNOSIS — I10 Essential (primary) hypertension: Secondary | ICD-10-CM | POA: Diagnosis not present

## 2022-01-03 DIAGNOSIS — S61411A Laceration without foreign body of right hand, initial encounter: Secondary | ICD-10-CM | POA: Diagnosis not present

## 2022-01-03 DIAGNOSIS — F419 Anxiety disorder, unspecified: Secondary | ICD-10-CM

## 2022-01-03 DIAGNOSIS — Z720 Tobacco use: Secondary | ICD-10-CM

## 2022-01-03 DIAGNOSIS — Z1211 Encounter for screening for malignant neoplasm of colon: Secondary | ICD-10-CM | POA: Insufficient documentation

## 2022-01-03 DIAGNOSIS — E669 Obesity, unspecified: Secondary | ICD-10-CM | POA: Diagnosis not present

## 2022-01-03 DIAGNOSIS — K219 Gastro-esophageal reflux disease without esophagitis: Secondary | ICD-10-CM | POA: Diagnosis not present

## 2022-01-03 MED ORDER — CEPHALEXIN 500 MG PO CAPS: 500.0000 mg | ORAL_CAPSULE | Freq: Three times a day (TID) | ORAL | 0 refills | Status: DC

## 2022-01-03 MED ORDER — ALPRAZOLAM 0.5 MG PO TABS: 0.5000 mg | ORAL_TABLET | Freq: Two times a day (BID) | ORAL | 0 refills | Status: DC | PRN
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
## 2022-01-03 NOTE — Assessment & Plan Note (Signed)
Patient with smoking

## 2022-01-03 NOTE — Assessment & Plan Note (Signed)
Given tetanus shot #2 patient was started on cephalexin

## 2022-01-03 NOTE — Assessment & Plan Note (Signed)

## 2022-01-03 NOTE — Assessment & Plan Note (Signed)

## 2022-01-11 ENCOUNTER — Ambulatory Visit: Payer: 59 | Admitting: Internal Medicine

## 2022-01-31 ENCOUNTER — Ambulatory Visit: Payer: 59 | Admitting: Internal Medicine

## 2022-02-02 ENCOUNTER — Other Ambulatory Visit: Payer: Self-pay

## 2022-02-02 DIAGNOSIS — F419 Anxiety disorder, unspecified: Secondary | ICD-10-CM

## 2022-02-02 MED ORDER — ALPRAZOLAM 0.5 MG PO TABS: 0.5000 mg | ORAL_TABLET | Freq: Two times a day (BID) | ORAL | 1 refills | Status: DC | PRN

## 2022-03-17 ENCOUNTER — Other Ambulatory Visit: Payer: Self-pay | Admitting: Internal Medicine

## 2022-03-17 DIAGNOSIS — F419 Anxiety disorder, unspecified: Secondary | ICD-10-CM

## 2022-03-17 DIAGNOSIS — I10 Essential (primary) hypertension: Secondary | ICD-10-CM

## 2022-03-21 ENCOUNTER — Other Ambulatory Visit: Payer: Self-pay | Admitting: Internal Medicine

## 2022-03-21 ENCOUNTER — Telehealth: Payer: Self-pay

## 2022-03-21 DIAGNOSIS — F419 Anxiety disorder, unspecified: Secondary | ICD-10-CM

## 2022-03-21 DIAGNOSIS — I10 Essential (primary) hypertension: Secondary | ICD-10-CM

## 2022-03-21 MED ORDER — ALPRAZOLAM 0.5 MG PO TABS: 0.5000 mg | ORAL_TABLET | Freq: Two times a day (BID) | ORAL | 1 refills | Status: AC | PRN

## 2022-03-21 NOTE — Telephone Encounter (Signed)
MEDICATION: ALPRAZolam (XANAX) 0.5 MG tablet   PHARMACY: [facility], [place]   Comments:   **Let patient know to contact pharmacy at the end of the day to make sure medication is ready. **  ** Please notify patient to allow 48-72 hours to process**  **Encourage patient to contact the pharmacy for refills or they can request refills through [facility]**

## 2022-03-21 NOTE — Telephone Encounter (Signed)
Refill printed and faxed to pharmacy.

## 2022-03-28 ENCOUNTER — Other Ambulatory Visit: Payer: Self-pay

## 2022-03-28 ENCOUNTER — Emergency Department
Admission: EM | Admit: 2022-03-28 | Discharge: 2022-03-28 | Disposition: A | Discharge status: Home / Self Care | Payer: 59 | Attending: Emergency Medicine | Admitting: Emergency Medicine

## 2022-03-28 ENCOUNTER — Encounter: Payer: Self-pay | Admitting: Emergency Medicine

## 2022-03-28 DIAGNOSIS — Z1152 Encounter for screening for COVID-19: Secondary | ICD-10-CM | POA: Diagnosis not present

## 2022-03-28 DIAGNOSIS — R509 Fever, unspecified: Secondary | ICD-10-CM | POA: Diagnosis not present

## 2022-03-28 DIAGNOSIS — I2510 Atherosclerotic heart disease of native coronary artery without angina pectoris: Secondary | ICD-10-CM | POA: Insufficient documentation

## 2022-03-28 DIAGNOSIS — R6889 Other general symptoms and signs: Secondary | ICD-10-CM

## 2022-03-28 LAB — RESP PANEL BY RT-PCR (RSV, FLU A&B, COVID)  RVPGX2
Influenza A by PCR: NEGATIVE
Influenza B by PCR: NEGATIVE
Resp Syncytial Virus by PCR: NEGATIVE
SARS Coronavirus 2 by RT PCR: NEGATIVE

## 2022-03-28 NOTE — ED Triage Notes (Signed)
PT here with a runny nose, chills, and generalized muscle aches since yesterday. Pt believes that he has the flu.

## 2022-03-28 NOTE — ED Provider Notes (Signed)
   [facility] Provider Note    Event Date/Time   First MD Initiated Contact with Patient 03/28/22 1046     (approximate)   History   Chills   HPI  Jack [name] is a 57 y.o. male with history of CAD, heart murmur, hypertension and hyperlipidemia presents emergency department stating that he has bodyaches fever and chills.  No vomiting or diarrhea.  No chest pain or shortness of breath      Physical Exam   Triage Vital Signs: ED Triage Vitals  Enc Vitals Group     BP 03/28/22 1000 (!) 125/100     Pulse Rate 03/28/22 0959 68     Resp 03/28/22 0959 18     Temp 03/28/22 0959 (!) 97.4 F (36.3 C)     Temp Source 03/28/22 0959 Oral     SpO2 03/28/22 0959 98 %     Weight 03/28/22 1000 196 lb 3.4 oz (89 kg)     Height 03/28/22 1000 5\' 5"  (1.651 m)     Head Circumference --      Peak Flow --      Pain Score 03/28/22 1000 9     Pain Loc --      Pain Edu? --      Excl. in [place]? --     Most recent vital signs: Vitals:   03/28/22 0959 03/28/22 1000  BP:  (!) 125/100  Pulse: 68   Resp: 18   Temp: (!) 97.4 F (36.3 C)   SpO2: 98%      General: Awake, no distress.   CV:  Good peripheral perfusion. regular rate and  rhythm Resp:  Normal effort. Lungs cta Abd:  No distention.   Other:      ED Results / Procedures / Treatments   Labs (all labs ordered are listed, but only abnormal results are displayed) Labs Reviewed  RESP PANEL BY RT-PCR (RSV, FLU A&B, COVID)  [id number]     EKG     RADIOLOGY     PROCEDURES:   Procedures   MEDICATIONS ORDERED IN ED: Medications - No data to display   IMPRESSION / MDM / [place] / ED COURSE  I reviewed the triage vital signs and the nursing notes.                              Differential diagnosis includes, but is not limited to, COVID, influenza, RSV, viral URI  Patient's presentation is most consistent with acute complicated illness / injury requiring diagnostic workup.    Respiratory panel is reassuring.  Did explain findings to the patient.  Take Tylenol and ibuprofen for fever/chills as needed.  Take over-the-counter cough medication.  Return emergency department worsening.  Patient is in agreement treatment plan.  He was discharged stable condition.      FINAL CLINICAL IMPRESSION(S) / ED DIAGNOSES   Final diagnoses:  Flu-like symptoms     Rx / DC Orders   ED Discharge Orders     None        Note:  This document was prepared using Dragon voice recognition software and may include unintentional dictation errors.    Versie Starks, PA-C 03/28/22 1057    Nance Pear, MD 03/28/22 [phone]

## 2022-03-28 NOTE — Discharge Instructions (Addendum)
Your respiratory panel is negative.  Not have the flu, COVID or RSV.  However if you are worsening you need to return the emergency department.  Take Tylenol and ibuprofen for fever and bodyaches.  Drink plenty of water.

## 2022-05-10 ENCOUNTER — Other Ambulatory Visit: Payer: Self-pay | Admitting: Internal Medicine

## 2022-05-10 DIAGNOSIS — G459 Transient cerebral ischemic attack, unspecified: Secondary | ICD-10-CM

## 2022-05-10 DIAGNOSIS — Q231 Congenital insufficiency of aortic valve: Secondary | ICD-10-CM

## 2022-05-10 DIAGNOSIS — Z95828 Presence of other vascular implants and grafts: Secondary | ICD-10-CM

## 2022-06-07 ENCOUNTER — Telehealth (HOSPITAL_COMMUNITY): Payer: Self-pay | Admitting: *Deleted

## 2022-06-07 NOTE — Telephone Encounter (Signed)
Attempted to call patient regarding upcoming cardiac CT appointment. °Left message on voicemail with name and callback number ° °Mairen Wallenstein RN Navigator Cardiac Imaging °[facility] °[phone] Office °[phone] Cell ° °

## 2022-06-09 ENCOUNTER — Ambulatory Visit: Admission: RE | Admit: 2022-06-09 | Payer: PRIVATE HEALTH INSURANCE | Source: Ambulatory Visit

## 2022-06-09 ENCOUNTER — Ambulatory Visit: Admission: RE | Admit: 2022-06-09 | Payer: 59 | Source: Ambulatory Visit

## 2022-06-28 ENCOUNTER — Telehealth (HOSPITAL_COMMUNITY): Payer: Self-pay | Admitting: Emergency Medicine

## 2022-06-28 NOTE — Telephone Encounter (Signed)
Attempted to call patient regarding upcoming cardiac CT appointment. °Left message on voicemail with name and callback number °Vonette Grosso RN Navigator Cardiac Imaging °[facility] °[phone] Office °[phone] Cell ° °

## 2022-06-29 ENCOUNTER — Other Ambulatory Visit: Payer: Self-pay | Admitting: Internal Medicine

## 2022-06-29 DIAGNOSIS — G459 Transient cerebral ischemic attack, unspecified: Secondary | ICD-10-CM

## 2022-06-30 ENCOUNTER — Ambulatory Visit: Admission: RE | Admit: 2022-06-30 | Payer: PRIVATE HEALTH INSURANCE | Source: Ambulatory Visit

## 2022-07-17 ENCOUNTER — Other Ambulatory Visit: Payer: Self-pay

## 2022-07-17 ENCOUNTER — Emergency Department
Admission: EM | Admit: 2022-07-17 | Discharge: 2022-07-17 | Disposition: A | Discharge status: Home / Self Care | Payer: 59 | Attending: Emergency Medicine | Admitting: Emergency Medicine

## 2022-07-17 DIAGNOSIS — D72829 Elevated white blood cell count, unspecified: Secondary | ICD-10-CM | POA: Diagnosis not present

## 2022-07-17 DIAGNOSIS — L03114 Cellulitis of left upper limb: Secondary | ICD-10-CM | POA: Diagnosis not present

## 2022-07-17 DIAGNOSIS — I10 Essential (primary) hypertension: Secondary | ICD-10-CM | POA: Diagnosis not present

## 2022-07-17 DIAGNOSIS — R21 Rash and other nonspecific skin eruption: Secondary | ICD-10-CM | POA: Diagnosis present

## 2022-07-17 DIAGNOSIS — I2510 Atherosclerotic heart disease of native coronary artery without angina pectoris: Secondary | ICD-10-CM | POA: Diagnosis not present

## 2022-07-17 LAB — BASIC METABOLIC PANEL
Anion gap: 7 (ref 5–15)
BUN: 15 mg/dL (ref 6–20)
CO2: 25 mmol/L (ref 22–32)
Calcium: 8.6 mg/dL — ABNORMAL LOW (ref 8.9–10.3)
Chloride: 105 mmol/L (ref 98–111)
Creatinine, Ser: 0.94 mg/dL (ref 0.61–1.24)
GFR, Estimated: >60 mL/min (ref >60)
Glucose, Bld: 111 mg/dL — ABNORMAL HIGH (ref 70–99)
Potassium: 3.4 mmol/L — ABNORMAL LOW (ref 3.5–5.1)
Sodium: 137 mmol/L (ref 135–145)

## 2022-07-17 LAB — CBC WITH DIFFERENTIAL/PLATELET
Abs Immature Granulocytes: 0.05 10*3/uL (ref 0.00–0.07)
Basophils Absolute: 0 10*3/uL (ref 0.0–0.1)
Basophils Relative: 0 %
Eosinophils Absolute: 0.3 10*3/uL (ref 0.0–0.5)
Eosinophils Relative: 3 %
HCT: 41.6 % (ref 39.0–52.0)
Hemoglobin: 14 g/dL (ref 13.0–17.0)
Immature Granulocytes: 1 %
Lymphocytes Relative: 15 %
Lymphs Abs: 1.6 10*3/uL (ref 0.7–4.0)
MCH: 31.8 pg (ref 26.0–34.0)
MCHC: 33.7 g/dL (ref 30.0–36.0)
MCV: 94.5 fL (ref 80.0–100.0)
Monocytes Absolute: 0.9 10*3/uL (ref 0.1–1.0)
Monocytes Relative: 8 %
Neutro Abs: 8 10*3/uL — ABNORMAL HIGH (ref 1.7–7.7)
Neutrophils Relative %: 73 %
Platelets: 143 10*3/uL — ABNORMAL LOW (ref 150–400)
RBC: 4.4 MIL/uL (ref 4.22–5.81)
RDW: 12.7 % (ref 11.5–15.5)
WBC: 10.9 10*3/uL — ABNORMAL HIGH (ref 4.0–10.5)
nRBC: 0 % (ref 0.0–0.2)

## 2022-07-17 MED ORDER — CEPHALEXIN 500 MG PO CAPS: 500.0000 mg | ORAL_CAPSULE | Freq: Four times a day (QID) | ORAL | 0 refills | Status: AC

## 2022-07-17 MED ORDER — SODIUM CHLORIDE 0.9 % IV SOLN
1.0000 g | Freq: Once | INTRAVENOUS | Status: AC
  Administered 2022-07-17: 1 g via INTRAVENOUS
  Filled 2022-07-17: qty 10

## 2022-07-17 MED ORDER — OXYCODONE-ACETAMINOPHEN 5-325 MG PO TABS
1.0000 | ORAL_TABLET | Freq: Once | ORAL | Status: AC
  Administered 2022-07-17: 1 via ORAL
  Filled 2022-07-17: qty 1

## 2022-07-17 MED ORDER — OXYCODONE-ACETAMINOPHEN 5-325 MG PO TABS: 1.0000 | ORAL_TABLET | ORAL | 0 refills | Status: AC | PRN

## 2022-07-17 MED ORDER — SULFAMETHOXAZOLE-TRIMETHOPRIM 800-160 MG PO TABS: 1.0000 | ORAL_TABLET | Freq: Two times a day (BID) | ORAL | 0 refills | Status: AC

## 2022-07-17 NOTE — ED Notes (Signed)
Attempted to gain IV access on patient with 2 attempts. Pt's wife upset at the bedside yelling what are you doing to him? Proceeds to say that this RN was going to let blood get all over his shirt. No blood visible on pt's shirt. Pt's wife states just have them get you a antibiotic and lets go home, lets not come back here again unless it's a different shift. This RN informed pt that there would not be anymore attempts from this RN. Charge RN Tovey notified of the interaction and situation.

## 2022-07-17 NOTE — Discharge Instructions (Signed)
Apply warm compresses to the area several times per day.  Take the antibiotics as prescribed and finish the full 10-day course.  Return to the ER immediately for new, worsening, or persistent severe pain, swelling, spreading of the rash, pain or streaking going up the arm, fever or chills, generalized weakness, or any other new or worsening symptoms that concern you.

## 2022-07-17 NOTE — ED Provider Notes (Signed)
[facility] Provider Note    Event Date/Time   First MD Initiated Contact with Patient 07/17/22 1302     (approximate)   History   Cellulitis   HPI  Jack [name] is a 57 y.o. male with a history of CAD, bicuspid aortic valve, hypertension, and hyperlipidemia who presents with area of swelling and rash to the left forearm, gradual onset over the last 3 to 4 days.  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  He states that the pain is starting to spread up the arm and down towards the hand.  However he has no pain on range of motion of the elbow or hand.  He denies any fever or chills.  He states that he has had some skin boils in the past.  I reviewed the past medical records.  The patient has no ED visits or admissions in the last few months.  He was most recently seen by cardiology on 2/26 for follow-up.     Physical Exam   Triage Vital Signs: ED Triage Vitals  Enc Vitals Group     BP 07/17/22 1037 (!) 146/68     Pulse Rate 07/17/22 1037 67     Resp 07/17/22 1037 16     Temp 07/17/22 1037 98.4 F (36.9 C)     Temp Source 07/17/22 1037 Oral     SpO2 07/17/22 1037 100 %     Weight 07/17/22 1039 200 lb (90.7 kg)     Height 07/17/22 1039 5\' 6"  (1.676 m)     Head Circumference --      Peak Flow --      Pain Score 07/17/22 1039 4     Pain Loc --      Pain Edu? --      Excl. in [place]? --     Most recent vital signs: Vitals:   07/17/22 1415 07/17/22 1430  BP:    Pulse: 63 62  Resp:    Temp:    SpO2: 100% 100%     General: Awake, no distress.  CV:  Good peripheral perfusion.  Resp:  Normal effort.  Abd:  No distention. Other:  Approximately 3 x 10 cm area of faint erythema, slight induration, and warmth to the medial/volar left forearm with mild swelling proximally and distally.  Central 2 cm pustular area with no significant fluctuance.  Full range of motion of the fingers,  hand, wrist, and elbow.  No visible streaking to the proximal arm.   ED Results / Procedures / Treatments   Labs (all labs ordered are listed, but only abnormal results are displayed) Labs Reviewed  CBC WITH DIFFERENTIAL/PLATELET - Abnormal; Notable for the following components:      Result Value   WBC 10.9 (*)    Platelets 143 (*)    Neutro Abs 8.0 (*)    All other components within normal limits  BASIC METABOLIC PANEL - Abnormal; Notable for the following components:   Potassium 3.4 (*)    Glucose, Bld 111 (*)    Calcium 8.6 (*)    All other components within normal limits     EKG   RADIOLOGY   PROCEDURES:  Critical Care performed: No  Procedures   MEDICATIONS ORDERED IN ED: Medications  cefTRIAXone (ROCEPHIN) 1 g in sodium chloride 0.9 % 100 mL IVPB (0 g Intravenous Stopped 07/17/22 1505)  oxyCODONE-acetaminophen (PERCOCET/ROXICET) 5-325 MG per  tablet 1 tablet (1 tablet Oral Given 07/17/22 1335)     IMPRESSION / MDM / ASSESSMENT AND PLAN / ED COURSE  I reviewed the triage vital signs and the nursing notes.  57 year old male with PMH as noted above presents with left forearm redness, swelling, and pain with an associated central pustule.  Differential diagnosis includes, but is not limited to, cellulitis, abscess.  There is no evidence of deep soft tissue infection.  There is no evidence of joint involvement.  The patient has full range of motion at all of the adjacent joints.  I performed a bedside ultrasound of the area.  There is no drainable abscess.  The patient states that he manually expressed some pus from the area previously.  Patient's presentation is most consistent with acute presentation with potential threat to life or bodily function.  The patient is on the cardiac monitor to evaluate for evidence of arrhythmia and/or significant heart rate changes.  We will obtain basic labs, give IV ceftriaxone and  reassess.  ----------------------------------------- 3:51 PM on 07/17/2022 -----------------------------------------  Lab workup shows mild leukocytosis and normal electrolytes.  I considered whether the patient may benefit from inpatient admission for additional IV antibiotics given the swelling in the arm and the relatively large area involved, however the patient is adamant that he does not want to stay in the hospital.  He has received a dose of IV ceftriaxone.  Given that his vital signs are normal, and he has no evidence of sepsis, systemic infection, or deep soft tissue involvement, I think that discharge with outpatient treatment is reasonable.  I have prescribed Keflex and Bactrim for MRSA coverage.  I gave the patient strict return precautions and he expressed understanding.  FINAL CLINICAL IMPRESSION(S) / ED DIAGNOSES   Final diagnoses:  Cellulitis of left upper extremity     Rx / DC Orders   ED Discharge Orders          Ordered    cephALEXin (KEFLEX) 500 MG capsule  4 times daily        07/17/22 1505    sulfamethoxazole-trimethoprim (BACTRIM DS) 800-160 MG tablet  2 times daily        07/17/22 1505    oxyCODONE-acetaminophen (PERCOCET) 5-325 MG tablet  Every 4 hours PRN        07/17/22 1505             Note:  This document was prepared using Dragon voice recognition software and may include unintentional dictation errors.    Dionne Bucy, MD 07/17/22 [phone]

## 2022-07-17 NOTE — ED Triage Notes (Signed)
Pt to ED with wife from home for unknown (insect bite?, pt unsure) area of redness and swelling to lateral L forearm since 2 days ago. Forearm appears swollen, red with purulent area in center of redness.

## 2022-09-07 ENCOUNTER — Encounter (HOSPITAL_COMMUNITY): Payer: Self-pay

## 2023-03-06 ENCOUNTER — Other Ambulatory Visit: Payer: Self-pay | Admitting: Internal Medicine

## 2023-05-26 ENCOUNTER — Emergency Department
Admission: EM | Admit: 2023-05-26 | Discharge: 2023-05-26 | Disposition: A | Discharge status: Home / Self Care | Attending: Emergency Medicine | Admitting: Emergency Medicine

## 2023-05-26 ENCOUNTER — Other Ambulatory Visit: Payer: Self-pay

## 2023-05-26 ENCOUNTER — Emergency Department

## 2023-05-26 DIAGNOSIS — I2510 Atherosclerotic heart disease of native coronary artery without angina pectoris: Secondary | ICD-10-CM | POA: Diagnosis not present

## 2023-05-26 DIAGNOSIS — X58XXXA Exposure to other specified factors, initial encounter: Secondary | ICD-10-CM | POA: Diagnosis not present

## 2023-05-26 DIAGNOSIS — I10 Essential (primary) hypertension: Secondary | ICD-10-CM | POA: Insufficient documentation

## 2023-05-26 DIAGNOSIS — Y990 Civilian activity done for income or pay: Secondary | ICD-10-CM | POA: Insufficient documentation

## 2023-05-26 DIAGNOSIS — S20211A Contusion of right front wall of thorax, initial encounter: Secondary | ICD-10-CM | POA: Insufficient documentation

## 2023-05-26 MED ORDER — CYCLOBENZAPRINE HCL 5 MG PO TABS: 5.0000 mg | ORAL_TABLET | Freq: Three times a day (TID) | ORAL | 0 refills | Status: AC | PRN

## 2023-05-26 MED ORDER — KETOROLAC TROMETHAMINE 30 MG/ML IJ SOLN
30.0000 mg | Freq: Once | INTRAMUSCULAR | Status: AC
  Administered 2023-05-26: 30 mg via INTRAMUSCULAR
  Filled 2023-05-26: qty 1

## 2023-05-26 MED ORDER — CYCLOBENZAPRINE HCL 10 MG PO TABS
10.0000 mg | ORAL_TABLET | Freq: Once | ORAL | Status: AC
Start: 2023-05-26 — End: 2023-05-26
  Administered 2023-05-26: 10 mg via ORAL
  Filled 2023-05-26: qty 1

## 2023-05-26 MED ORDER — KETOROLAC TROMETHAMINE 10 MG PO TABS: 10.0000 mg | ORAL_TABLET | Freq: Three times a day (TID) | ORAL | 0 refills | Status: AC

## 2023-05-26 MED ORDER — LIDOCAINE 5 % EX PTCH: 1.0000 | MEDICATED_PATCH | Freq: Two times a day (BID) | CUTANEOUS | 0 refills | Status: AC | PRN

## 2023-05-26 MED ORDER — KETOROLAC TROMETHAMINE 30 MG/ML IJ SOLN
INTRAMUSCULAR | Status: AC
  Filled 2023-05-26: qty 1

## 2023-05-26 MED ORDER — LIDOCAINE 5 % EX PTCH
1.0000 | MEDICATED_PATCH | Freq: Once | CUTANEOUS | Status: DC
  Administered 2023-05-26: 1 via TRANSDERMAL
  Filled 2023-05-26: qty 1

## 2023-05-26 MED ORDER — HYDROCODONE-ACETAMINOPHEN 5-325 MG PO TABS
1.0000 | ORAL_TABLET | Freq: Once | ORAL | Status: AC
  Administered 2023-05-26: 1 via ORAL
  Filled 2023-05-26: qty 1

## 2023-05-26 NOTE — ED Provider Notes (Signed)
 [facility] Emergency Department Provider Note     Event Date/Time   First MD Initiated Contact with Patient 05/26/23 2149     (approximate)   History   Rib Injury   HPI  Jack [name] is a 58 y.o. male with a history of HTN, HLD, and CAD, presents to the ED for evaluation of mechanical injury.  Patient presents himself to the ED for right rib pain.  He reports being at work, where he backed into a pole, and had contusion to his posterior right rib cage.  He denies any head injury or LOC.  Presents to the ED in no acute distress.  Physical Exam   Triage Vital Signs: ED Triage Vitals  Encounter Vitals Group     BP 05/26/23 2106 (!) 162/94     Systolic BP Percentile --      Diastolic BP Percentile --      Pulse Rate 05/26/23 2106 98     Resp 05/26/23 2106 20     Temp 05/26/23 2106 98.2 F (36.8 C)     Temp Source 05/26/23 2106 Oral     SpO2 05/26/23 2106 98 %     Weight 05/26/23 2107 198 lb 6.6 oz (90 kg)     Height 05/26/23 2107 5\' 6"  (1.676 m)     Head Circumference --      Peak Flow --      Pain Score 05/26/23 2107 8     Pain Loc --      Pain Education --      Exclude from Growth Chart --     Most recent vital signs: Vitals:   05/26/23 2106  BP: (!) 162/94  Pulse: 98  Resp: 20  Temp: 98.2 F (36.8 C)  SpO2: 98%    General Awake, no distress. NAD HEENT NCAT. PERRL. EOMI. No rhinorrhea. Mucous membranes are moist.  CV:  Good peripheral perfusion. RRR RESP:  Normal effort. CTA no chest wall deformity, abrasion, ecchymosis, or edema noted. ABD:  No distention.  Soft and nontender normal bowel sounds x 4. MSK:  Normal active range of motion of all extremities.   ED Results / Procedures / Treatments   Labs (all labs ordered are listed, but only abnormal results are displayed) Labs Reviewed - No data to display   EKG   RADIOLOGY  I personally viewed and evaluated these images as part of my medical decision making, as  well as reviewing the written report by the radiologist.  ED Provider Interpretation: No acute findings; evidence of chronic 10 through 12 lateral rib fractures  DG Ribs Unilateral W/Chest Right Result Date: 05/26/2023 CLINICAL DATA:  Right rib pain, injury at work. EXAM: RIGHT RIBS AND CHEST - 3+ VIEW COMPARISON:  07/01/2021 FINDINGS: Left pacer remains in place, unchanged. Prior CABG. Heart mediastinal contours within normal limits. Lungs clear. No effusions or pneumothorax. Posterior and lateral rib fractures noted in the right 10th through 12th ribs. These appear most likely to be chronic as there appears to be callus as well as well corticated margins. No definite acute rib fracture. IMPRESSION: Multiple rib fractures involving the posterior and lateral right 10th through 12th ribs which appear chronic. No definite acute rib fracture. No acute cardiopulmonary disease. Electronically Signed   By: Charlett Nose M.D.   On: 05/26/2023 22:26     PROCEDURES:  Critical Care performed: No  Procedures   MEDICATIONS ORDERED IN ED: Medications  ketorolac (TORADOL) 30 MG/ML  injection 30 mg (has no administration in time range)  lidocaine (LIDODERM) 5 % 1 patch (1 patch Transdermal Patch Applied 05/26/23 2240)  cyclobenzaprine (FLEXERIL) tablet 10 mg (10 mg Oral Given 05/26/23 2240)  HYDROcodone-acetaminophen (NORCO/VICODIN) 5-325 MG per tablet 1 tablet (1 tablet Oral Given 05/26/23 2240)     IMPRESSION / MDM / ASSESSMENT AND PLAN / ED COURSE  I reviewed the triage vital signs and the nursing notes.                              Differential diagnosis includes, but is not limited to, rib contusion, chest wall pain, rib fracture, hematoma, abrasion, myalgia  Patient's presentation is most consistent with acute complicated illness / injury requiring diagnostic workup.  Patient's diagnosis is consistent with chest wall/rib contusion on the right.  Patient was reassured exam and workup at this time.   No signs of any serious underlying lung injury.  No evidence of any acute rib fracture, based on my interpretation of images.  Patient likely represents a chest wall contusion.  Patient will be discharged home with prescriptions for ketorolac, Lidoderm patches, and Flexeril. Patient is to follow up with his primary provider as discussed, as needed or otherwise directed. Patient is given ED precautions to return to the ED for any worsening or new symptoms.   FINAL CLINICAL IMPRESSION(S) / ED DIAGNOSES   Final diagnoses:  Contusion of rib on right side, initial encounter     Rx / DC Orders   ED Discharge Orders          Ordered    cyclobenzaprine (FLEXERIL) 5 MG tablet  3 times daily PRN        05/26/23 2238    lidocaine (LIDODERM) 5 %  Every 12 hours PRN        05/26/23 2238    ketorolac (TORADOL) 10 MG tablet  Every 8 hours       Note to Pharmacy: IV/IM DOSE GIVEN IN THE ED   05/26/23 2238             Note:  This document was prepared using Dragon voice recognition software and may include unintentional dictation errors.    Lissa Hoard, PA-C 05/26/23 2245    Chesley Noon, MD 05/26/23 2308

## 2023-05-26 NOTE — ED Triage Notes (Signed)
 Pt to ed from home via POV for Right sided RIB pain from injuring it today. Pt was at work and backed into a pole. Pt has pain in his posterior rib cage. Pt is caox4, in no acute distress and ambulatory in triage.

## 2023-05-26 NOTE — Discharge Instructions (Addendum)
 Your exam and x-rays are normal and reassuring at this time.  No signs of an acute (new) rib fracture.  You do have a rib contusion without evidence of any rib fracture or lung injury.  Take the prescription meds as directed.  Follow-up with your primary provider for ongoing evaluation.  Return to the ED needed.
